# Patient Record
Sex: Female | Born: 1960 | Race: White | Hispanic: No | Marital: Married | Smoking: Never smoker
Health system: Southern US, Community
[De-identification: ages and names within clinical notes are randomized; demographics above are authoritative.]

## PROBLEM LIST (undated history)

## (undated) DIAGNOSIS — L709 Acne, unspecified: Secondary | ICD-10-CM

## (undated) DIAGNOSIS — K259 Gastric ulcer, unspecified as acute or chronic, without hemorrhage or perforation: Secondary | ICD-10-CM

## (undated) DIAGNOSIS — S82891A Other fracture of right lower leg, initial encounter for closed fracture: Secondary | ICD-10-CM

## (undated) DIAGNOSIS — M1990 Unspecified osteoarthritis, unspecified site: Secondary | ICD-10-CM

## (undated) DIAGNOSIS — R569 Unspecified convulsions: Secondary | ICD-10-CM

## (undated) DIAGNOSIS — F419 Anxiety disorder, unspecified: Secondary | ICD-10-CM

## (undated) HISTORY — DX: Unspecified osteoarthritis, unspecified site: M19.90

## (undated) HISTORY — PX: JOINT REPLACEMENT: SHX530

## (undated) HISTORY — DX: Unspecified convulsions: R56.9

## (undated) HISTORY — DX: Other fracture of right lower leg, initial encounter for closed fracture: S82.891A

## (undated) HISTORY — DX: Gastric ulcer, unspecified as acute or chronic, without hemorrhage or perforation: K25.9

## (undated) HISTORY — PX: TONSILLECTOMY: SUR1361

## (undated) HISTORY — DX: Acne, unspecified: L70.9

---

## 1993-03-29 HISTORY — PX: KNEE ARTHROSCOPY: SUR90

## 1995-03-30 HISTORY — PX: BREAST LUMPECTOMY: SHX2

## 1997-10-29 ENCOUNTER — Other Ambulatory Visit: Admission: RE | Admit: 1997-10-29 | Discharge: 1997-10-29 | Payer: Self-pay | Admitting: Gynecology

## 1998-10-28 ENCOUNTER — Other Ambulatory Visit: Admission: RE | Admit: 1998-10-28 | Discharge: 1998-10-28 | Payer: Self-pay | Admitting: Gynecology

## 1999-11-20 ENCOUNTER — Other Ambulatory Visit: Admission: RE | Admit: 1999-11-20 | Discharge: 1999-11-20 | Payer: Self-pay | Admitting: Gynecology

## 2000-09-13 ENCOUNTER — Ambulatory Visit (HOSPITAL_COMMUNITY): Admission: RE | Admit: 2000-09-13 | Discharge: 2000-09-13 | Payer: Self-pay | Admitting: Gynecology

## 2000-09-13 ENCOUNTER — Encounter: Payer: Self-pay | Admitting: Gynecology

## 2001-04-04 ENCOUNTER — Other Ambulatory Visit: Admission: RE | Admit: 2001-04-04 | Discharge: 2001-04-04 | Payer: Self-pay | Admitting: Gynecology

## 2001-10-03 ENCOUNTER — Other Ambulatory Visit: Admission: RE | Admit: 2001-10-03 | Discharge: 2001-10-03 | Payer: Self-pay | Admitting: Gynecology

## 2002-05-31 ENCOUNTER — Other Ambulatory Visit: Admission: RE | Admit: 2002-05-31 | Discharge: 2002-05-31 | Payer: Self-pay | Admitting: Gynecology

## 2003-05-22 ENCOUNTER — Ambulatory Visit (HOSPITAL_COMMUNITY): Admission: RE | Admit: 2003-05-22 | Discharge: 2003-05-22 | Payer: Self-pay | Admitting: Gynecology

## 2003-06-28 ENCOUNTER — Other Ambulatory Visit: Admission: RE | Admit: 2003-06-28 | Discharge: 2003-06-28 | Payer: Self-pay | Admitting: Gynecology

## 2004-09-21 ENCOUNTER — Other Ambulatory Visit: Admission: RE | Admit: 2004-09-21 | Discharge: 2004-09-21 | Payer: Self-pay | Admitting: Gynecology

## 2005-09-23 ENCOUNTER — Other Ambulatory Visit: Admission: RE | Admit: 2005-09-23 | Discharge: 2005-09-23 | Payer: Self-pay | Admitting: Gynecology

## 2005-11-02 ENCOUNTER — Encounter: Admission: RE | Admit: 2005-11-02 | Discharge: 2005-11-02 | Payer: Self-pay | Admitting: Gynecology

## 2005-12-17 ENCOUNTER — Encounter (INDEPENDENT_AMBULATORY_CARE_PROVIDER_SITE_OTHER): Payer: Self-pay | Admitting: Specialist

## 2005-12-17 ENCOUNTER — Ambulatory Visit (HOSPITAL_COMMUNITY): Admission: RE | Admit: 2005-12-17 | Discharge: 2005-12-17 | Payer: Self-pay | Admitting: Gynecology

## 2005-12-31 ENCOUNTER — Emergency Department (HOSPITAL_COMMUNITY): Admission: EM | Admit: 2005-12-31 | Discharge: 2006-01-01 | Payer: Self-pay | Admitting: Emergency Medicine

## 2006-05-23 ENCOUNTER — Encounter: Admission: RE | Admit: 2006-05-23 | Discharge: 2006-05-23 | Payer: Self-pay | Admitting: Family Medicine

## 2006-09-28 ENCOUNTER — Other Ambulatory Visit: Admission: RE | Admit: 2006-09-28 | Discharge: 2006-09-28 | Payer: Self-pay | Admitting: Gynecology

## 2006-11-28 HISTORY — PX: ABLATION: SHX5711

## 2006-12-28 HISTORY — PX: ANKLE FRACTURE SURGERY: SHX122

## 2007-01-31 ENCOUNTER — Encounter: Admission: RE | Admit: 2007-01-31 | Discharge: 2007-01-31 | Payer: Self-pay | Admitting: Gynecology

## 2007-10-13 ENCOUNTER — Other Ambulatory Visit: Admission: RE | Admit: 2007-10-13 | Discharge: 2007-10-13 | Payer: Self-pay | Admitting: Obstetrics & Gynecology

## 2008-03-26 ENCOUNTER — Encounter: Admission: RE | Admit: 2008-03-26 | Discharge: 2008-03-26 | Payer: Self-pay | Admitting: Obstetrics & Gynecology

## 2008-03-28 ENCOUNTER — Encounter: Admission: RE | Admit: 2008-03-28 | Discharge: 2008-03-28 | Payer: Self-pay | Admitting: Obstetrics & Gynecology

## 2010-08-14 NOTE — Op Note (Signed)
NAMELAKARA, Gloria Rivera              ACCOUNT NO.:  [id number]   MEDICAL RECORD NO.:  [id number]          PATIENT TYPE:  AMB   LOCATION:  SDC                           FACILITY:  [facility]   PHYSICIAN:  Ivor Costa. Farrel Gobble, M.D. DATE OF BIRTH:  1961/03/02   DATE OF PROCEDURE:  12/17/2005  DATE OF DISCHARGE:                                 OPERATIVE REPORT   PREOPERATIVE DIAGNOSIS:  Menorrhagia.   POSTOPERATIVE DIAGNOSIS:  Menorrhagia.   PROCEDURE:  Dilatation and curettage, hysteroscopy, with NovaSure  endometrial ablation.   SURGEON:  Ivor Costa. Farrel Gobble, M.D.   ANESTHESIA:  General with paracervical block of 10 mL of 0.25% Marcaine.   ESTIMATED BLOOD LOSS:  Minimal.   I&O deficit of lactated Ringer's is approximately 75 mL.   URINE OUTPUT:  Not measured.   FINDINGS:  The uterus was sounded to 9 cm.  The cervix sounded to 3.5.  The  width was 4.5.  The cavity appeared smooth.  What appeared to be a polyp was  removed on endometrial curettings.  Cervix was unremarkable.   PROCEDURE:  The patient was taken to the operating room and general  anesthesia was induced, placed in dorsal lithotomy position, prepped and  draped in the usual sterile fashion.  A bimanual exam was performed,  orientation of the uterus was confirmed.  A bivalve speculum was then placed  in the vagina.  The cervix was visualized, stabilized with a single-tooth  tenaculum, and a paracervical block was placed deep for a total of 10 mL.  The uterine cavity was sounded on two separate the patient's and returned 9.  Using the #8 Pratt dilator, the cervix sounded to 3.5, again done twice.  The NovaSure instrument was then prepared.  The hysteroscope was then  advanced through the cervix.  Dilation to 21 was required to pass into the  cavity.  The cavity was only remarkable for being very wide.  The cavity  appeared smooth.  Endometrial curettings, however, seemed to return a  polypoid-like tissue.  The hysteroscope was then  advanced back through the  cervix and no further residual was seen.  The NovaSure was then advanced  through the cervix.  The fundus was tapped.  The NovaSure was then seated  using the usual maneuvers.  The width of the cavity had been noted to be  4.5.  The CO2 test was passed and then the ablation occurred to the power of  133 for 105 seconds.  The NovaSure was then removed and the hysteroscope was  re-advanced through the  cervix.  A good junction between the cervix and endometrial cavity was seen.  There was good char effect throughout the cavity, which was otherwise  unremarkable.  The patient tolerated procedure well, sponge, lap and needle  counts were correct x2, and she was transferred to the PACU in stable  condition.      Ivor Costa. Farrel Gobble, M.D.  Electronically Signed     THL/MEDQ  D:  12/17/2005  T:  12/19/2005  Job:  [id number]

## 2010-09-21 ENCOUNTER — Other Ambulatory Visit: Payer: Self-pay | Admitting: Obstetrics & Gynecology

## 2010-09-21 DIAGNOSIS — Z1231 Encounter for screening mammogram for malignant neoplasm of breast: Secondary | ICD-10-CM

## 2010-09-24 ENCOUNTER — Ambulatory Visit: Payer: Self-pay

## 2010-10-05 ENCOUNTER — Ambulatory Visit
Admission: RE | Admit: 2010-10-05 | Discharge: 2010-10-05 | Disposition: A | Discharge status: Home / Self Care | Payer: BC Managed Care – PPO | Source: Ambulatory Visit | Attending: Obstetrics & Gynecology | Admitting: Obstetrics & Gynecology

## 2010-10-05 DIAGNOSIS — Z1231 Encounter for screening mammogram for malignant neoplasm of breast: Secondary | ICD-10-CM

## 2010-10-08 ENCOUNTER — Other Ambulatory Visit: Payer: Self-pay | Admitting: Obstetrics & Gynecology

## 2010-10-08 DIAGNOSIS — R928 Other abnormal and inconclusive findings on diagnostic imaging of breast: Secondary | ICD-10-CM

## 2010-10-12 ENCOUNTER — Ambulatory Visit
Admission: RE | Admit: 2010-10-12 | Discharge: 2010-10-12 | Disposition: A | Discharge status: Home / Self Care | Payer: BC Managed Care – PPO | Source: Ambulatory Visit | Attending: Obstetrics & Gynecology | Admitting: Obstetrics & Gynecology

## 2010-10-12 DIAGNOSIS — R928 Other abnormal and inconclusive findings on diagnostic imaging of breast: Secondary | ICD-10-CM

## 2010-10-23 ENCOUNTER — Encounter (HOSPITAL_BASED_OUTPATIENT_CLINIC_OR_DEPARTMENT_OTHER)
Admission: RE | Admit: 2010-10-23 | Discharge: 2010-10-23 | Disposition: A | Discharge status: Home / Self Care | Payer: BC Managed Care – PPO | Source: Ambulatory Visit | Attending: Orthopedic Surgery | Admitting: Orthopedic Surgery

## 2010-10-27 ENCOUNTER — Ambulatory Visit (HOSPITAL_BASED_OUTPATIENT_CLINIC_OR_DEPARTMENT_OTHER)
Admission: RE | Admit: 2010-10-27 | Discharge: 2010-10-27 | Disposition: A | Discharge status: Home / Self Care | Payer: BC Managed Care – PPO | Source: Ambulatory Visit | Attending: Orthopedic Surgery | Admitting: Orthopedic Surgery

## 2010-10-27 DIAGNOSIS — Z01812 Encounter for preprocedural laboratory examination: Secondary | ICD-10-CM | POA: Insufficient documentation

## 2010-10-27 DIAGNOSIS — M67919 Unspecified disorder of synovium and tendon, unspecified shoulder: Secondary | ICD-10-CM | POA: Insufficient documentation

## 2010-10-27 DIAGNOSIS — M7500 Adhesive capsulitis of unspecified shoulder: Secondary | ICD-10-CM | POA: Insufficient documentation

## 2010-10-27 DIAGNOSIS — M719 Bursopathy, unspecified: Secondary | ICD-10-CM | POA: Insufficient documentation

## 2010-10-27 LAB — POCT HEMOGLOBIN-HEMACUE: Hemoglobin: 14 g/dL (ref 12.0–15.0)

## 2010-10-29 NOTE — Op Note (Signed)
NAMECAYCEE, Rivera NO.:  [id number]  MEDICAL RECORD NO.:  [id number]  LOCATION:                                 FACILITY:  PHYSICIAN:  Katy Fitch. Pao Haffey, M.D. DATE OF BIRTH:  [birth year]  DATE OF PROCEDURE:  10/27/2010 DATE OF DISCHARGE:                              OPERATIVE REPORT   PREOPERATIVE DIAGNOSIS:  Painful right shoulder x3 years with multiple pathologies including; 1. Clinical adhesive capsulitis. 2. MRI documented partial subscapularis tendinopathy. 3. MRI documented partial-thickness rotator cuff tear.  POSTOPERATIVE DIAGNOSES: 1. Adhesive capsulitis. 2. 10% upper subscapularis degenerative tear. 3. 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 4. Adhesive bursitis of subacromial and subdeltoid space.  OPERATIONS: 1. Examination of right shoulder under anesthesia documenting     significant adhesive capsulitis. 2. Gentle manipulation of shoulder. 3. Arthroscopic capsulectomy with debridement of rotator interval,     granulation tissue, tenolysis of subscapularis tendon and     hemostasis. 4. Partial debridement of subscapularis and supraspinatus partial     thickness degenerative tendon tears. 5. Subacromial bursoscopy and bursectomy with tenolysis of rotator     cuff.  The partial-thickness rotator cuff tendon tear was measured     at approximately 5 mm width with maximum loss of the attachment at     greater tuberosity of approximately 6 mm.  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 Cindee Rivera for evaluation and management of a chronically painful right shoulder.  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  Her clinical examination suggested adhesive capsulitis.  Dr. Merlyn Lot performed an MRI, which revealed partial-thickness rotator cuff tearing.  She was  referred for an upper extremity orthopedic shoulder consult.  Preoperatively, we had a detailed discussion regarding her predicament. I offered steroid injection for her adhesive capsulitis symptoms.  She is very needle phobic and requested that we proceed to examination of her shoulder under anesthesia in appropriate intervention.  Preoperatively, we reminded Gloria Rivera that we cannot do a procedure designed to recover motion and a procedure to repair tendons at the same time.  Rotator cuff repair in the setting of adhesive capsulitis can be very problematic.  Therefore, we typically will address recovery of motion as our index procedure and if her partial-thickness rotator cuff tears become more symptomatic at a later date, we would recommend a second procedure to repair the tendons.  Questions regarding the anticipated procedure were invited and answered in detail.  Preoperatively, Gloria Rivera and I had a detailed informed consent.  She also had detailed anesthesia informed consent with Dr. Gelene Mink.  General anesthesia by endotracheal technique with a supplemental plexus block was recommended and accepted.  Under Dr. Thornton Dales direct supervision, IV access was obtained in the holding area followed by placement of a ultrasound-guided right brachial plexus block with ropivacaine.  This was well tolerated.  Gloria Rivera was then transferred to room #2 of the [facility] and placed in supine position on the operating table.  Under Dr. Thornton Dales direct supervision, general endotracheal anesthesia  was accomplished with the aid of a GlideScope.  She was then carefully positioned into the beach-chair position with the aid of a torso and head holder designed for arthroscopy with passive pumps on her calves and a warming blanket.  Examination of the right shoulder under anesthesia revealed moderate adhesive capsulitis with combined elevation of 160, external  rotation limited at 70 degrees with 90 degrees abduction and internal rotation of less than 20 degrees.  With gentle manipulation, we increased her range of motion to combined elevation of 180 degrees external, rotation 90 degrees, abduction 95 degrees, internal rotation 80 degrees with 90 degrees abduction.  We then had a routine time-out.  Her right shoulder and forequarter were prepped with DuraPrep and draped with impervious arthroscopy drapes.  A skin marking pencil was used to delineate the landmarks of the shoulder followed by placement of the arthroscopic cannula through a standard posterior-viewing portal.  Diagnostic arthroscopy revealed intact hyaline articular cartilage surfaces of the glenoid and humeral head.  The biceps origin was stable at the superior labrum.  The biceps tendon was normal through the rotator interval.  The subscapularis was examined with a 30 and 70 degree scope.  The upper 10% of the fibers of the subscapularis were degenerative and fibrillated.  The remaining footprint of the subscapularis appeared normal.  There was no crescent sign.  The biceps tendon was stable at the bicipital groove.  The subscapularis central and inferior aspect was completely normal.  There were significant adhesive capsulitis granulation tissues of the rotator interval and significant scarring of the subscapularis to the region of the anterior superior glenohumeral ligament.  The deep surface of the supraspinous and infraspinatus was inspected.  There was degenerative tendon noted.  With the aid of a suction shaver brought in through an anterior portal, we performed tenolysis of the subscapularis, debridement of the fibrillated portion of the subscapularis.  The rotator interval granulation tissue was removed and hemostasis was achieved with bipolar cautery.  The supraspinatus tendon was debrided to stable tendon fiber.  We then measured with a spinal needle brought in through  an anterolateral portal, the thickness of the cuff.  The measurements on the scope, shaver were used to ascertain that the PASTA lesion was between 30% and 50% the typical thickness of the rotator cuff insertion.  After completion of the intra-articular debridement, hemostasis was achieved with bipolar cautery.  In my judgment given that her primary pain complaint was due to adhesive capsulitis.  Our treatment plan will be to recover motion and strength initially.  If her discomfort from a partial articular surface rotator cuff tear continuous to be problematic, at some point as a cuff tear progresses, formal repair of the rotator cuff would be indicated.  In my experience, it is extremely challenging to repair of the rotator cuff in the setting of active adhesive capsulitis and avoid extreme stiffness.  After completion of the intra-articular portion of the procedure, the scope was removed and placed in the subacromial space.  Florid bursitis was identified.  A complete bursectomy was accomplished with a suction shaver, brought in through an anterolateral portal.  Hemostasis was achieved with bipolar cautery.  The intact surface of the bursal side of the rotator cuff was documented with a digital camera.  The arthroscopic equipment was removed.  The portals were repaired with 3-0 Prolene followed by application of voluminous dressing with sterile gauze, ABD pads, and paper tape.  Mrs. Pop was was placed in a sling, transferred to  the recovery room with stable vital signs.  She will return to our office for follow up in 24 hours for dressing change and placement of protective Tegaderm dressings to the portals and initiation of range of motion program under the supervision of our physical therapy team.     Katy Fitch. Allannah Kempen, M.D.     RVS/MEDQ  D:  10/27/2010  T:  10/28/2010  Job:  [id number]  cc:   Gloria Rivera, M.D. Dario Guardian, M.D.  Electronically Signed by  Josephine Igo M.D. on 10/29/2010 10:03:49 AM

## 2011-06-11 ENCOUNTER — Encounter (HOSPITAL_COMMUNITY): Payer: Self-pay | Admitting: Emergency Medicine

## 2011-06-11 ENCOUNTER — Encounter (HOSPITAL_COMMUNITY): Payer: Self-pay | Admitting: *Deleted

## 2011-06-11 ENCOUNTER — Emergency Department (HOSPITAL_COMMUNITY)
Admission: EM | Admit: 2011-06-11 | Discharge: 2011-06-11 | Disposition: A | Discharge status: Home / Self Care | Payer: BC Managed Care – PPO | Attending: Emergency Medicine | Admitting: Emergency Medicine

## 2011-06-11 ENCOUNTER — Emergency Department (HOSPITAL_COMMUNITY)
Admission: EM | Admit: 2011-06-11 | Discharge: 2011-06-11 | Discharge status: Against Medical Advice | Payer: BC Managed Care – PPO | Attending: Emergency Medicine | Admitting: Emergency Medicine

## 2011-06-11 DIAGNOSIS — R296 Repeated falls: Secondary | ICD-10-CM | POA: Insufficient documentation

## 2011-06-11 DIAGNOSIS — IMO0002 Reserved for concepts with insufficient information to code with codable children: Secondary | ICD-10-CM | POA: Insufficient documentation

## 2011-06-11 DIAGNOSIS — S01501A Unspecified open wound of lip, initial encounter: Secondary | ICD-10-CM | POA: Insufficient documentation

## 2011-06-11 DIAGNOSIS — Z79899 Other long term (current) drug therapy: Secondary | ICD-10-CM | POA: Insufficient documentation

## 2011-06-11 DIAGNOSIS — Y9389 Activity, other specified: Secondary | ICD-10-CM | POA: Insufficient documentation

## 2011-06-11 DIAGNOSIS — F411 Generalized anxiety disorder: Secondary | ICD-10-CM | POA: Insufficient documentation

## 2011-06-11 DIAGNOSIS — S01511A Laceration without foreign body of lip, initial encounter: Secondary | ICD-10-CM

## 2011-06-11 DIAGNOSIS — Y998 Other external cause status: Secondary | ICD-10-CM | POA: Insufficient documentation

## 2011-06-11 HISTORY — DX: Anxiety disorder, unspecified: F41.9
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
## 2011-06-11 NOTE — ED Notes (Signed)
dermabond used by [employer] EDPA

## 2011-06-11 NOTE — ED Notes (Signed)
Bleeding controlled at this time.

## 2011-06-11 NOTE — Discharge Instructions (Signed)
Keep face clean with mild soap and water. Do not pick off skin glue rather let it naturally fall off. Monitor face for signs or infection to include increasing redness, swelling or drainage of wound. After pearly scar appears it is very important to keep scar covered with sun screen to get the best cosmetic outcome.   Facial Laceration A facial laceration is a cut on the face. It can take 1 to 2 years for the scar to heal completely. HOME CARE  For stitches (sutures):  Keep the cut clean and dry.   If you have a bandage (dressing), change it at least once a day. Change the bandage if it gets wet or dirty, or as told by your doctor.   Wash the cut with soap and water 2 times a day. Rinse the cut with water. Pat it dry with a clean towel.   Put a thin layer of medicated cream on the cut as told by your doctor.   You may shower after the first 24 hours. Do not soak the cut in water until the stitches are removed.   Only take medicines as told by your doctor.   Have your stitches removed as told by your doctor.   Do not wear makeup until a few days after your stitches are removed.  For skin adhesive strips:  Keep the cut clean and dry.   Do not get the strips wet. You may take a bath, but be careful to keep the cut dry.   If the cut gets wet, pat it dry with a clean towel.   The strips will fall off on their own. Do not remove the strips that are still stuck to the cut.  For wound glue:  You may shower or take baths. Do not soak or scrub the cut. Do not swim. Avoid heavy sweating until the glue falls off on its own. After a shower or bath, pat the cut dry with a clean towel.   Do not put medicine or makeup on your cut until the glue falls off.   If you have a bandage, do not put tape over the glue.   Avoid lots of sunlight or tanning lamps until the glue falls off. Put sunscreen on the cut for the first year to reduce your scar.   The glue will fall off on its own. Do not pick at  the glue.  You may need a tetanus shot if:  You cannot remember when you had your last tetanus shot.   You have never had a tetanus shot.  If you need a tetanus shot and you choose not to have one, you may get tetanus. Sickness from tetanus can be serious. GET HELP RIGHT AWAY IF:   Your cut gets red, painful, or puffy (swollen).   There is yellowish-white fluid (pus) coming from the cut.   You have chills or a fever.  MAKE SURE YOU:   Understand these instructions.   Will watch your condition.   Will get help right away if you are not doing well or get worse.  Document Released: 09/01/2007 Document Revised: 03/04/2011 Document Reviewed: 09/08/2010 [facility] Patient Information 2012 [place], [place].
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
## 2011-06-11 NOTE — ED Provider Notes (Signed)
Medical screening examination/treatment/procedure(s) were performed by non-physician practitioner and as supervising physician I was immediately available for consultation/collaboration.   Avalyn Molino, MD 06/11/11 1537 
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
## 2011-11-03 ENCOUNTER — Other Ambulatory Visit: Payer: Self-pay | Admitting: Obstetrics & Gynecology

## 2011-11-03 DIAGNOSIS — Z1231 Encounter for screening mammogram for malignant neoplasm of breast: Secondary | ICD-10-CM

## 2011-11-04 ENCOUNTER — Ambulatory Visit: Payer: BC Managed Care – PPO

## 2011-12-06 ENCOUNTER — Ambulatory Visit
Admission: RE | Admit: 2011-12-06 | Discharge: 2011-12-06 | Disposition: A | Discharge status: Home / Self Care | Payer: BC Managed Care – PPO | Source: Ambulatory Visit | Attending: Obstetrics & Gynecology | Admitting: Obstetrics & Gynecology

## 2011-12-06 DIAGNOSIS — Z1231 Encounter for screening mammogram for malignant neoplasm of breast: Secondary | ICD-10-CM

## 2012-10-05 ENCOUNTER — Telehealth: Payer: Self-pay | Admitting: Gynecology

## 2012-10-05 NOTE — Telephone Encounter (Signed)
Medication question for nurse re: generic Effexor. Wants to increase dosage.

## 2012-10-05 NOTE — Telephone Encounter (Signed)
Ok to increase to 150, please call in, refill to annual

## 2012-10-05 NOTE — Telephone Encounter (Signed)
Spoke with pt about generic effexor 75 mg she has been taking for 1.5 years. SM originally prescribed, and TL refilled more recently. Pt is experiencing a stressful summer with daughter getting married, a grandchild on the way, and an impending move. Pt feeling stressed out, and wondering if an increase in her dose would help get her through the next few months. Please advise.

## 2012-10-06 MED ORDER — VENLAFAXINE HCL ER 150 MG PO CP24: 150.0000 mg | ORAL_CAPSULE | Freq: Every day | ORAL | Status: DC

## 2012-10-06 NOTE — Telephone Encounter (Signed)
Patient aware of OK from Dr. Farrel Gobble to increase Effexor to 150mg  capsule once a day. Notified of AEX is due 04/18/2013. Rx e-scribed to [facility].

## 2012-12-27 HISTORY — PX: OTHER SURGICAL HISTORY: SHX169

## 2012-12-28 ENCOUNTER — Ambulatory Visit: Payer: Self-pay | Admitting: Otolaryngology

## 2013-01-01 LAB — PATHOLOGY REPORT

## 2013-01-11 ENCOUNTER — Other Ambulatory Visit: Payer: Self-pay

## 2013-04-17 ENCOUNTER — Encounter: Payer: Self-pay | Admitting: Gynecology

## 2013-04-18 ENCOUNTER — Ambulatory Visit: Payer: Self-pay | Admitting: Gynecology

## 2013-04-23 ENCOUNTER — Ambulatory Visit (INDEPENDENT_AMBULATORY_CARE_PROVIDER_SITE_OTHER): Payer: Federal, State, Local not specified - PPO | Admitting: Gynecology

## 2013-04-23 ENCOUNTER — Encounter: Payer: Self-pay | Admitting: Gynecology

## 2013-04-23 VITALS — BP 117/77 | HR 71 | Resp 14 | Ht 66.0 in | Wt 154.0 lb

## 2013-04-23 DIAGNOSIS — Z0000 Encounter for general adult medical examination without abnormal findings: Secondary | ICD-10-CM

## 2013-04-23 DIAGNOSIS — N943 Premenstrual tension syndrome: Secondary | ICD-10-CM

## 2013-04-23 DIAGNOSIS — Z01419 Encounter for gynecological examination (general) (routine) without abnormal findings: Secondary | ICD-10-CM

## 2013-04-23 LAB — POCT URINALYSIS DIPSTICK
RBC UA: 1
UROBILINOGEN UA: NEGATIVE
pH, UA: 5

## 2013-04-23 MED ORDER — VENLAFAXINE HCL ER 150 MG PO CP24
150.0000 mg | ORAL_CAPSULE | Freq: Every day | ORAL | Status: DC
Start: 2013-04-23 — End: 2014-01-11

## 2013-04-23 NOTE — Progress Notes (Signed)
53 y.o. Married Caucasian female   [phone] here for annual exam. Pt reports menses are not absent since ablation.  She does not report hot flashes, rarely have night sweats, does not have vaginal dryness.  She is not using lubricants.  She does not report post-menopasual bleeding.  Pt recently moved to [place] but is here for annual.  Pt does not have  Menopausal symptoms, reports monthly mastalgia has resolved.   No LMP recorded. Patient has had an ablation.          Sexually active: yes  The current method of family planning is Ablation & Husband has vasectomy.    Exercising: yes  walking qd Last pap: 03/25/11 NEG HR HPV Abnormal PAP: no Mammogram: 12/07/11 Bi-Rads 1 BSE: no Colonoscopy: none DEXA: none Alcohol: 2 drinks/wk Tobacco: no   Health Maintenance  Topic Date Due  . Pap Smear  04/29/1977  . Tetanus/tdap  04/29/1978  . Colonoscopy  04/29/2009  . Influenza Vaccine  10/27/2012  . Mammogram  12/05/2013    Family History  Problem Relation Age of Onset  . Heart attack Maternal Uncle   . Breast cancer Maternal Grandmother     There are no active problems to display for this patient.   Past Medical History  Diagnosis Date  . Anxiety   . Adult acne   . Stomach ulcer   . Fracture of right ankle     Past Surgical History  Procedure Laterality Date  . Joint replacement    . Knee arthroscopy Left 1995  . Ankle fracture surgery Right 12/2006  . Ablation  11/2006  . Breast lumpectomy  1997    Benign    Allergies: Bee venom  Current Outpatient Prescriptions  Medication Sig Dispense Refill  . ampicillin (PRINCIPEN) 500 MG capsule Take 500 mg by mouth 2 (two) times daily.      . calcium carbonate (OS-CAL) 600 MG TABS Take 600 mg by mouth daily.      . citalopram (CELEXA) 20 MG tablet Take 20 mg by mouth daily.      . ergocalciferol (VITAMIN D2) 50000 UNITS capsule Take 50,000 Units by mouth once a week. Thursday      . L-Lysine 500 MG TABS Take 1 tablet by mouth  daily.      Marland Kitchen LORazepam (ATIVAN) 1 MG tablet Take 1.5 mg by mouth once as needed. Before appointments for anxiety      . Multiple Vitamin (MULITIVITAMIN WITH MINERALS) TABS Take 1 tablet by mouth daily.      Marland Kitchen venlafaxine XR (EFFEXOR-XR) 150 MG 24 hr capsule Take 1 capsule (150 mg total) by mouth daily. Will need to call and make AEX appointment @ January 21st. For further refills.  30 capsule  7   No current facility-administered medications for this visit.    ROS: Pertinent items are noted in HPI.  Exam:    Ht 5\' 6"  (1.676 m)  Wt 154 lb (69.854 kg)  BMI 24.87 kg/m2 Weight change: @WEIGHTCHANGE @ Last 3 height recordings:  Ht Readings from Last 3 Encounters:  04/23/13 5\' 6"  (1.676 m)   General appearance: alert, cooperative and appears stated age Head: Normocephalic, without obvious abnormality, atraumatic Neck: no adenopathy, no carotid bruit, no JVD, supple, symmetrical, trachea midline and thyroid not enlarged, symmetric, no tenderness/mass/nodules Lungs: clear to auscultation bilaterally Breasts: normal appearance, no masses or tenderness Heart: regular rate and rhythm, S1, S2 normal, no murmur, click, rub or gallop Abdomen: soft, non-tender; bowel sounds normal; no  masses,  no organomegaly Extremities: extremities normal, atraumatic, no cyanosis or edema Skin: Skin color, texture, turgor normal. No rashes or lesions Lymph nodes: Cervical, supraclavicular, and axillary nodes normal. no inguinal nodes palpated Neurologic: Grossly normal   Pelvic: External genitalia:  no lesions              Urethra: normal appearing urethra with no masses, tenderness or lesions              Bartholins and Skenes: normal                 Vagina: normal appearing vagina with normal color and discharge, no lesions              Cervix: normal appearance              Pap taken: no        Bimanual Exam:  Uterus:  uterus is normal size, shape, consistency and nontender                                       Adnexa:    normal adnexa in size, nontender and no masses                                      Rectovaginal: Confirms                                      Anus:  normal sphincter tone, no lesions  A: well woman     P: mammogram annual pap smear  Not indicated Refill effexor  return annually or prn Discussed PAP guideline changes, importance of weight bearing exercises, calcium, vit D and balanced diet.  An After Visit Summary was printed and given to the patient.

## 2013-04-23 NOTE — Patient Instructions (Signed)

## 2014-01-11 ENCOUNTER — Telehealth: Payer: Self-pay | Admitting: Gynecology

## 2014-01-11 DIAGNOSIS — N943 Premenstrual tension syndrome: Secondary | ICD-10-CM

## 2014-01-11 MED ORDER — VENLAFAXINE HCL ER 75 MG PO CP24: 225.0000 mg | ORAL_CAPSULE | Freq: Every day | ORAL | Status: DC

## 2014-01-11 NOTE — Telephone Encounter (Signed)
Pt would like to speak with nurse regarding her medication. Pt available until 1:00 or after 2:45.

## 2014-01-11 NOTE — Telephone Encounter (Signed)
Called pt.  Advised I was increasing to 225mg  daily.  She will take 3 capsules daily.  Advised to take the same time daily.  She will call with any issues.  Encounter closed.

## 2014-01-11 NOTE — Telephone Encounter (Signed)
Spoke with patient. She is requesting to increase her effexor-xr dose. She is feeling increased symptoms of depression and would like to increase dose if possible. Father has recently passed away. 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  Patient has moved out of area but is current on AEX, last with Dr. Farrel GobbleLathrop 03/2013. She is [facility] in [place].   Patient denies thoughts of self harm or harm to anyone else.  Advised patient would send her request to Dr. Hyacinth MeekerMiller to request increase in effexor-xr. New pharmacy information placed. Advised would call back with message from Dr. Hyacinth MeekerMiller. Patient agreeable.

## 2014-01-28 ENCOUNTER — Encounter: Payer: Self-pay | Admitting: Gynecology

## 2014-07-19 NOTE — Op Note (Signed)
PATIENT NAME:  [name], [name] MR#:  [id number] DATE OF BIRTH:  December 22, 1960  DATE OF PROCEDURE:  12/28/2012  PREOPERATIVE DIAGNOSIS:  Frontal skull osteomas (3).   POSTOPERATIVE DIAGNOSIS:  Frontal skull osteomas (3).    PROCEDURE:  Resection of the frontal skull osteomas.   SURGEON: Zackery BarefootJ. Madison Monte Bronder, M.D.   DESCRIPTION OF PROCEDURE: The patient was placed in the supine position on the operating room table. After general LMA anesthesia had been induced, the patient was turned 90 degrees counterclockwise from anesthesia and placed in a modified beach chair position. The osteomas were palpated. The planned incisions were marked, locally anesthetized, prepped and draped in the usual fashion. A #15 blade was used to make an incision on the left side. The first dissection was carried down to the osteoma in a subfrontalis plane. The osteoma was skeletonized with a Therapist, nutritionalreer elevator and a curved osteotomes was used to remove the osteoma. A straight osteotome was used to remove the slightly elevated central residual central core. The more superior osteoma was skeletonized in a similar fashion and removed with a curved osteotome. The right side incision was made and again isolated in a frontalis plane and then the periosteum was reflected off of the osteoma and it was also removed with an osteotome curved at first and then straight. The 4 mm cutting burr on the TPS system was then used to smooth out the contours. The wounds were then irrigated, suctioned and the wounds were closed with 5-0 Vicryl and 6-0 nylon suture. The patient was returned to anesthesia, allowed to emerge from anesthesia in the operating room, and taken to the recovery room in stable condition. There were no complications.   ESTIMATED BLOOD LOSS:  10 mL    ____________________________ J. Gertie BaronMadison Cairo Agostinelli, MD j[facility]:cc D: 12/28/2012 21:00:00 ET T: 12/28/2012 21:35:17 ET JOB#: [id number]  cc: Zackery BarefootJ. Madison Cam Dauphin, MD, <Dictator> Wendee CoppJMADISON Lamiracle Chaidez  MD ELECTRONICALLY SIGNED 01/01/2013 8:37

## 2014-08-15 ENCOUNTER — Other Ambulatory Visit: Payer: Self-pay | Admitting: Obstetrics & Gynecology

## 2014-08-15 NOTE — Telephone Encounter (Signed)
Dr. Hyacinth MeekerMiller patient has moved to [place], please advise.

## 2014-08-15 NOTE — Telephone Encounter (Signed)
Pt returned call and left message that [place] has moved to [place] and will not be scheduling an appointment.

## 2014-08-15 NOTE — Telephone Encounter (Signed)
Rx declined  

## 2014-08-15 NOTE — Telephone Encounter (Signed)
Medication refill request: Effexor 75 mg  Last AEX:  04/23/13 with TL  Next AEX:  No AEX scheduled Last MMG (if hormonal medication request): N/A Refill authorized: ?  Left Message To Call Back

## 2014-09-10 ENCOUNTER — Telehealth: Payer: Self-pay | Admitting: Obstetrics & Gynecology

## 2014-09-10 NOTE — Telephone Encounter (Signed)
That should be ok.  Please let he know I will be coming from the OR so I cannot guarantee I will be here on time but will try.

## 2014-09-10 NOTE — Telephone Encounter (Signed)
Left message to call Denyce Harr at [phone]. 

## 2014-09-10 NOTE — Telephone Encounter (Signed)
Spoke with patient. Patient states that she recently moved to [facility] but is a long time patient of Dr.Miller's. Patient did see another MD in [place] for her aex but has been experiencing menopausal symptoms. Patient states all of her labs were normal and no further recommendations were made. "I really trust Dr.Miller and have seen her forever. I am coming back into town for a day and a half and really want to see her." Patient will be in town 6/20 and the morning of 6/21. Advised patient will need to check with provider regarding scheduling as she will be performing surgery those two mornings. Patient is agreeable.   Dr.Miller, please review and advise. There is a 11:15am slot after surgery on 6/21 available if this is okay?

## 2014-09-10 NOTE — Telephone Encounter (Signed)
Patient is having some menopausal symptoms and would like to talk to Dr.Miller. Last seen 04/23/13.

## 2014-09-10 NOTE — Telephone Encounter (Signed)
Spoke with patient. Advised of message as seen below from Dr.Miller. Patient is agreeable and verbalizes understanding. Appointment scheduled for 6/21 at 11:15am with Dr.Miller. Patient placed on wait list in EPIC for 6/20. Patient is agreeable,  Routing to provider for final review. Patient agreeable to disposition. Will close encounter.

## 2014-09-17 ENCOUNTER — Ambulatory Visit (INDEPENDENT_AMBULATORY_CARE_PROVIDER_SITE_OTHER): Payer: Federal, State, Local not specified - PPO | Admitting: Obstetrics & Gynecology

## 2014-09-17 ENCOUNTER — Other Ambulatory Visit: Payer: Self-pay | Admitting: Obstetrics & Gynecology

## 2014-09-17 ENCOUNTER — Encounter: Payer: Self-pay | Admitting: Obstetrics & Gynecology

## 2014-09-17 VITALS — BP 102/66 | HR 80 | Resp 16 | Ht 66.0 in | Wt 146.0 lb

## 2014-09-17 DIAGNOSIS — M25559 Pain in unspecified hip: Secondary | ICD-10-CM

## 2014-09-17 DIAGNOSIS — M791 Myalgia, unspecified site: Secondary | ICD-10-CM

## 2014-09-17 DIAGNOSIS — N951 Menopausal and female climacteric states: Secondary | ICD-10-CM | POA: Diagnosis not present

## 2014-09-17 LAB — COMPREHENSIVE METABOLIC PANEL
ALBUMIN: 4.2 g/dL (ref 3.5–5.2)
ALK PHOS: 73 U/L (ref 39–117)
ALT: 19 U/L (ref 0–35)
AST: 22 U/L (ref 0–37)
BUN: 10 mg/dL (ref 6–23)
CALCIUM: 9.5 mg/dL (ref 8.4–10.5)
CHLORIDE: 103 meq/L (ref 96–112)
CO2: 27 mEq/L (ref 19–32)
Creat: 0.51 mg/dL (ref 0.50–1.10)
Glucose, Bld: 84 mg/dL (ref 70–99)
POTASSIUM: 4.2 meq/L (ref 3.5–5.3)
SODIUM: 140 meq/L (ref 135–145)
TOTAL PROTEIN: 6.8 g/dL (ref 6.0–8.3)
Total Bilirubin: 0.4 mg/dL (ref 0.2–1.2)

## 2014-09-17 LAB — CBC
HEMATOCRIT: 39.6 % (ref 36.0–46.0)
HEMOGLOBIN: 13 g/dL (ref 12.0–15.0)
MCH: 29.6 pg (ref 26.0–34.0)
MCHC: 32.8 g/dL (ref 30.0–36.0)
MCV: 90.2 fL (ref 78.0–100.0)
MPV: 9.8 fL (ref 8.6–12.4)
Platelets: 461 10*3/uL — ABNORMAL HIGH (ref 150–400)
RBC: 4.39 MIL/uL (ref 3.87–5.11)
RDW: 13.5 % (ref 11.5–15.5)
WBC: 6.8 10*3/uL (ref 4.0–10.5)

## 2014-09-17 MED ORDER — CELECOXIB 200 MG PO CAPS: 200.0000 mg | ORAL_CAPSULE | Freq: Two times a day (BID) | ORAL | Status: DC

## 2014-09-17 NOTE — Progress Notes (Signed)
Patient ID: Gloria Rivera, female   DOB: 11/01/1960, 54 y.o.   MRN: [id number]  54 yo G3P3 MWF here for discussion of recent changes in hormonal symptoms.  Pt has been amenorrheic since having an endometrial ablation so really had no idea where she was in regards to menopause until the last several months.  She has really begun to have hot flashes and nights sweats since January.  As well, she is also experiencing new issues as well with joint pain, muscle aches, and fatigue.  She has been taking black cohosh 30mg  twice daily.  This really helped the hot flashes but not the joint and muscle issues.  She recently decided to stop it and see what happened and the hot flashes came right back.  She has restarted the black cohosh and states she can tell a difference.  Reports she broke a toe in January.  After two to three months, it was still red and swollen so she saw an ortho in [place] (where she now lives).  Had x ray done and the toe wasn't healing.  She did report hip pains as well.  Had hip and knee x rays as well and this was negative.  Was started on calcium and Vit D and follow up x rays now show her toe is heeling.  She's found this worrisome given the joint and muscle aches.   With the black cohosh, the hot flashes are much better but the biggest issue is with soreness.  Has tried advil and this does help so if she takes prescription dosages.  Doesn't feel aleve really helps.  Did see NP at gyn office in [place] in May and had blood work.  She brings copies of this today.  It will be scanned into EPIC.  Rheumatoid Factor, ANA were both negative.  Estradiol was low, FSH was 97, Progesterone was low.  TSH was normal.    Pt really not interested in increased breast cancer risks with HRT.  Also wonders if something else is going on since she is having so many body ache issues.  Hasn't established care with PCP in [place] yet.  Highly encouraged to do this.  States she hates [place] and feels like she is  in hell so hasn't really wanted to establish care with anyone.   Assessment:  Joint aches and muscle aches, fatigue since becoming menopausal Declines HRT  Plan:  CBC, CMB, Vit D Celebrex 200mg  daily.  Rx sent to pharmacy in [place]. Highly encouraged to establish care with PCP in [place] area.   Trial of topical progesterone recommended as well.  Can start with obtaining from health food store.  Information provided.    ~30 minutes spent with patient >50% of time was in face to face discussion of above.

## 2014-09-17 NOTE — Patient Instructions (Signed)
Purchase a progesterone cream from [employer] or [employer] and use 1-2 pumps daily to skin.

## 2014-09-18 ENCOUNTER — Telehealth: Payer: Self-pay | Admitting: Obstetrics & Gynecology

## 2014-09-18 DIAGNOSIS — M791 Myalgia, unspecified site: Secondary | ICD-10-CM | POA: Insufficient documentation

## 2014-09-18 DIAGNOSIS — N951 Menopausal and female climacteric states: Secondary | ICD-10-CM | POA: Insufficient documentation

## 2014-09-18 DIAGNOSIS — M25559 Pain in unspecified hip: Secondary | ICD-10-CM | POA: Insufficient documentation

## 2014-09-18 LAB — VITAMIN D 25 HYDROXY (VIT D DEFICIENCY, FRACTURES): VIT D 25 HYDROXY: 54 ng/mL (ref 30–100)

## 2014-09-18 NOTE — Telephone Encounter (Signed)
Patient has some questions regarding her Celebrex prescription directions.

## 2014-09-18 NOTE — Telephone Encounter (Signed)
Call to patient. She has picked up her prescription and states that order is for Celebrex 200 mg po bid. However, prescription is written for only 30 tablets, so would only be 15 day supply. Advised patient reviewed Dr. Rondel Baton note for 200 mg daily so will need to clarify with Dr. Hyacinth Meeker.   Patient agreeable. States "No rush at all." Advised would return call to patient with clarification of order and patient agreeable.   Marland Kitchen

## 2014-09-19 LAB — IRON AND TIBC
%SAT: 44 % (ref 20–55)
IRON: 117 ug/dL (ref 42–145)
TIBC: 263 ug/dL (ref 250–470)
UIBC: 146 ug/dL (ref 125–400)

## 2014-09-19 LAB — FERRITIN: Ferritin: 66 ng/mL (ref 10–291)

## 2014-09-19 NOTE — Telephone Encounter (Signed)
Should be 200mg  daily, only, not BID.  Thanks.

## 2014-09-19 NOTE — Telephone Encounter (Signed)
Call to patient and message from Dr. Hyacinth Meeker given. To take one tablet daily. Medication reconciliation changed to one tablet daily. Patient agreeable. She will return call with any concerns. Routing to provider for final review. Patient agreeable to disposition. Will close encounter.

## 2014-09-24 ENCOUNTER — Telehealth: Payer: Self-pay | Admitting: Obstetrics & Gynecology

## 2014-09-24 NOTE — Telephone Encounter (Signed)
Spoke with patient. Advised of message as seen below from Dr.Miller. Patient is agreeable and verbalizes understanding. Patient would like Dr.Miller to know she has an internist appointment on 10/15/2014 in her local area.

## 2014-09-24 NOTE — Telephone Encounter (Signed)
Spoke with patient. Patient states that she has been researching elevated platelet counts and saw that using Tretinoin gel can increase platelet counts. Patient states she has been using Tretinoin 0.01% gel daily for 4 months every night to entire face. Advised will let Dr.Miller know and we will be in contact with her soon regarding further recommendations. Patient is agreeable.

## 2014-09-24 NOTE — Telephone Encounter (Signed)
Patient is asking to talk with Gloria Rivera again.  °

## 2014-09-24 NOTE — Telephone Encounter (Signed)
Routing to Dr.Miller for review and advise of labs from 09/17/2014. All appears normal except Platelet level which is elevated at 461.

## 2014-09-24 NOTE — Telephone Encounter (Signed)
You can inform her of the elvated platelet level and that I also did iron testing as this is a common reason for elevated platelet ct.  It was normal as well.  I have left message for local hematologist/oncologist to see if any additional testing is needed or if she needs to see local specialist.  I am holding results in my in-box until I get an answer.  Will be back in contact with her regarding any additional testing and/or referral depending on his recommendations.  [place] lives in [place] so I know anything else that is needed will need to be done there.  Thanks.

## 2014-09-24 NOTE — Telephone Encounter (Signed)
Patient is calling to get her results from her last week visit

## 2014-09-26 NOTE — Telephone Encounter (Signed)
Spoke with patient. Patient calling to check on status of call. Advised will speak with Dr.Miller today and return call with update. Patient is agreeable.

## 2014-09-26 NOTE — Telephone Encounter (Signed)
Dr. Myna HidalgoEnnever thought this was moist likely related to joint issues and with all other testing normal, nothing special is needed except repeat it in a year to monitor.  Has the Celebrex helped at all?

## 2014-09-27 NOTE — Telephone Encounter (Signed)
No it shouldn't.  I really think she needs to see a rheumatologist in the [place] area.  I really can't make any recommendations to her due to being so far away and not knowing anyone there.  I know she doesn't have a great relationship with PCP but I highly recommend she get a referral from them.  Also, why taking it at night?  She's sleeping through some of the benefit.  Maybe should take in AM.

## 2014-09-27 NOTE — Telephone Encounter (Signed)
Spoke with patient. Advised of message as seen below from Dr.Miller. Patient is agreeable and verbalizes understanding. Patient states the Celebrex was been "awesome. I notice about a 60 percent change. I take it at night and feel great in the morning. Throughout the day it starts to get a little rough." Patient asking if dosage can be increased at this time to twice per day to help get through the evening. Advised I will speak with Dr.Miller regarding rx and return call. Patient is agreeable.

## 2014-09-27 NOTE — Telephone Encounter (Signed)
Left message to call Kaitlyn at [phone]. 

## 2014-09-27 NOTE — Telephone Encounter (Signed)
Spoke with patient. Advised of message as seen below from Dr.Miller. Patient is agreeable. Patient will begin to take Celebrex in the AM. Has an appointment with internist on 10/15/2014 will discuss referral at that time.  Routing to provider for final review. Patient agreeable to disposition. Will close encounter.

## 2014-10-12 ENCOUNTER — Other Ambulatory Visit: Payer: Self-pay | Admitting: Obstetrics & Gynecology

## 2014-10-14 NOTE — Telephone Encounter (Signed)
Medication refill request: Celebrex 200 Last AEX:  04/23/13 with TL Next AEX: none Last MMG (if hormonal medication request):  Refill authorized: Please advise

## 2014-10-15 NOTE — Telephone Encounter (Signed)
RX declined.  Message send to triage to call pt and see about the follow up she has done.

## 2014-10-16 ENCOUNTER — Telehealth: Payer: Self-pay

## 2014-10-16 NOTE — Telephone Encounter (Signed)
Spoke with patient. Patient states that she established care with an internist yesterday who is running a variety of different tests and lab work on her. States her new internist will be refilling her Celebrex for her. States she does not need a refill at this time as she has plenty of the medication. For further refills will go through her internist.   Routing to provider for final review. Patient agreeable to disposition. Will close encounter.   Patient aware provider will review message and nurse will return call if any additional advice or change of disposition.

## 2014-10-16 NOTE — Telephone Encounter (Signed)
-----   Message from [name], MD sent at 10/15/2014 10:27 AM EDT ----- Regarding: follow up with pt Pt seen here recently due to joint issues/pain.  She was started on celebrex.  [place] lives in [place] and I advised her to follow up with PCP and possibly see rheumatologist.  Refill for celebrex came and I'd like some follow up about any appts she's had before I do a RF.  May also need records.  Thanks.

## 2015-09-01 ENCOUNTER — Ambulatory Visit: Payer: Federal, State, Local not specified - PPO | Admitting: Obstetrics & Gynecology

## 2015-10-17 ENCOUNTER — Encounter: Payer: Self-pay | Admitting: Obstetrics & Gynecology

## 2015-10-17 ENCOUNTER — Ambulatory Visit (INDEPENDENT_AMBULATORY_CARE_PROVIDER_SITE_OTHER): Payer: Self-pay | Admitting: Obstetrics & Gynecology

## 2015-10-17 VITALS — BP 96/58 | HR 86 | Resp 14 | Ht 66.0 in | Wt 152.0 lb

## 2015-10-17 DIAGNOSIS — Z01419 Encounter for gynecological examination (general) (routine) without abnormal findings: Secondary | ICD-10-CM

## 2015-10-17 DIAGNOSIS — Z124 Encounter for screening for malignant neoplasm of cervix: Secondary | ICD-10-CM

## 2015-10-17 MED ORDER — ESTRADIOL-NORETHINDRONE ACET 0.5-0.1 MG PO TABS: 1.0000 | ORAL_TABLET | Freq: Every day | ORAL | Status: DC

## 2015-10-17 MED ORDER — AMPICILLIN 500 MG PO CAPS: 500.0000 mg | ORAL_CAPSULE | Freq: Every day | ORAL | Status: DC

## 2015-10-17 MED ORDER — VENLAFAXINE HCL ER 150 MG PO CP24: 150.0000 mg | ORAL_CAPSULE | Freq: Every day | ORAL | Status: DC

## 2015-10-17 NOTE — Progress Notes (Signed)
55 y.o. [id number] Married Caucasian F here for annual exam.  Reports last year she had so many issues with her joints and worsening hot flashes.  This was changing her life.  [place] moved to [place] last year and they moved back to [place].  Husband's position [place] still based [place] but his territory is [place].  They've been back in [place] for 9 days.    No LMP recorded. Patient has had an ablation.          Sexually active: Yes.    The current method of family planning is vasectomy.    Exercising: Yes.    walking Smoker:  no  Health Maintenance: Pap:  08/2013 per patient  History of abnormal Pap:  yes MMG: 12/2014 BIRADS 1 negative at [facility] Colonoscopy:  never BMD:   07/30/2015 at [facility] Osteopenia, -2.2/-2.4 repeat 3-5 years TDaP:  Up to date Pneumonia vaccine(s):  never Zostavax:   never Hep C testing: discussed with pt.  Pt is needle phobic and will plan to do this with next lab work. Screening Labs: done with Rheumatologist in April, Hb today: done with Rheumatologist in April, Urine today: done with Rheumatologist in April    reports that she has never smoked. She has never used smokeless tobacco. She reports that she drinks about 0.6 oz of alcohol per week. She reports that she does not use illicit drugs.  Past Medical History  Diagnosis Date  . Anxiety   . Adult acne   . Stomach ulcer   . Fracture of right ankle     Past Surgical History  Procedure Laterality Date  . Joint replacement    . Knee arthroscopy Left 1995  . Ankle fracture surgery Right 12/2006  . Ablation  11/2006  . Breast lumpectomy  1997    Benign  . Osteoma removal  12/2012    Current Outpatient Prescriptions  Medication Sig Dispense Refill  . BLACK COHOSH PO Take 30 mg by mouth 2 (two) times daily.    . celecoxib (CELEBREX) 200 MG capsule Take 200 mg by mouth daily.    Marland Kitchen. L-Lysine 500 MG TABS Take 1 tablet by mouth daily.    . Multiple Vitamin (MULITIVITAMIN WITH MINERALS) TABS Take 1 tablet by mouth  daily.    Marland Kitchen. venlafaxine XR (EFFEXOR-XR) 150 MG 24 hr capsule Take 300 mg by mouth daily with breakfast.     No current facility-administered medications for this visit.    Family History  Problem Relation Age of Onset  . Heart attack Maternal Uncle   . Breast cancer Maternal Grandmother     ROS:  Pertinent items are noted in HPI.  Otherwise, a comprehensive ROS was negative.  Exam:   Filed Vitals:   10/17/15 1113  BP: 96/58  Pulse: 86  Resp: 14  Height: 5\' 6"  (1.676 m)  Weight: 152 lb (68.947 kg)    General appearance: alert, cooperative and appears stated age Head: Normocephalic, without obvious abnormality, atraumatic Neck: no adenopathy, supple, symmetrical, trachea midline and thyroid normal to inspection and palpation Lungs: clear to auscultation bilaterally Breasts: normal appearance, no masses or tenderness Heart: regular rate and rhythm Abdomen: soft, non-tender; bowel sounds normal; no masses,  no organomegaly Extremities: extremities normal, atraumatic, no cyanosis or edema Skin: Skin color, texture, turgor normal. No rashes or lesions Lymph nodes: Cervical, supraclavicular, and axillary nodes normal. No abnormal inguinal nodes palpated Neurologic: Grossly normal   Pelvic: External genitalia:  no lesions  Urethra:  normal appearing urethra with no masses, tenderness or lesions              Bartholins and Skenes: normal                 Vagina: normal appearing vagina with normal color and discharge, no lesions              Cervix: no lesions              Pap taken: Yes.   Bimanual Exam:  Uterus:  normal size, contour, position, consistency, mobility, non-tender              Adnexa: normal adnexa and no mass, fullness, tenderness               Rectovaginal: Confirms               Anus:  normal sphincter tone, no lesions  Chaperone was present for exam.  A:  Well Woman with normal exam PMP, on HRT Acne  Osteopenia  P:   Mammogram yearly pap  smear with HR HPV Change HRT to Activella HS 0.5/0.1mg  daily.  #90/4RF RF Effexor  XL daily.  #90/4RF Ampicillin  daily . #90/0RF.  She has appt with Dr. Yetta Barre in September.  (Couldn't get any earlier appt.) Repeat BMD 2018-2019 return annually or prn

## 2015-10-17 NOTE — Patient Instructions (Signed)
 500mg  - 1000 calcium daily.  OsCal Caltrate [phone] IU Vit D daily.

## 2015-10-21 LAB — IPS PAP TEST WITH HPV

## 2016-06-16 ENCOUNTER — Encounter: Payer: Self-pay | Admitting: Obstetrics & Gynecology

## 2016-06-16 ENCOUNTER — Telehealth: Payer: Self-pay

## 2016-06-16 NOTE — Telephone Encounter (Signed)
Telephone encounter created to review with Dr.Miller. 

## 2016-06-16 NOTE — Telephone Encounter (Signed)
Non-Urgent Medical Question  Message [phone]  From Melina ModenaJill P Alejandro To [name], MD Sent 06/16/2016 2:33 PM  Dr. Hyacinth MeekerMiller,  As part of a job application process for [employer], I disclosed that I currently take the prescription medication Effexor (Venlafaxine). They simply need to know from you, the prescribing physician, why I take it. At this point, I'm not even sure why I take it. I don't have anxiety or depression, but I do know that it was originally to help ease PMS symptoms, then (I think) to help with menopause symptoms. I also need a statement from you that I've not had a back disorder or lower leg weakness (this one's a mystery to me). They told me to fax it to them once I receive it. I so appreciate your time and hope you're doing well. See you at my next checkup.  Sincerely,  Gloria SkainsJill Rivera   Responsible Party   Pool - [facility] Clinical Pool No one has taken responsibility for this message.  No actions have been taken on this message.   Dr.Miller, okay to write letter for patient? Should she continue this medication at this time? Please advise.

## 2016-06-17 ENCOUNTER — Encounter: Payer: Self-pay | Admitting: Obstetrics & Gynecology

## 2016-06-17 NOTE — Telephone Encounter (Signed)
Letter has been written.  Gloria Rivera will likely want to read it and make sure it is what she needs.  [facility].  If she desires any changes, she can let us know.  Then I will likely need print and sign a copy for her.

## 2016-06-17 NOTE — Telephone Encounter (Signed)
Letter mailed to patient's home address on file. Will close encounter.

## 2016-06-17 NOTE — Telephone Encounter (Signed)
Spoke with patient. Advised letter has been written by Dr.Miller. Patient would like to review letter through MyChart. Advised letter will be sent out to her now for her review. Patient will let our office know if anything needs to be added or changed. Requests that the final signed copy be mailed to her home address [address] [place], [place] [id number].

## 2016-06-17 NOTE — Progress Notes (Addendum)
Letter sent to patient via MyChart for review.

## 2016-06-17 NOTE — Telephone Encounter (Signed)
Ok to send and close encounter.  Thanks.

## 2016-06-17 NOTE — Telephone Encounter (Signed)
Non-Urgent Medical Question  Message [phone]  From Melina ModenaJill P Daus To [name], MD Sent 06/17/2016 9:08 AM  Perfect letter. Thank you so much! I truly appreciate your time.  Noreene LarssonJill   Responsible Party   Pool - [facility] Clinical Pool No one has taken responsibility for this message.  No actions have been taken on this message.   Letter printed and to Dr.Miller for final review and signature before mailing to patient.

## 2016-08-25 ENCOUNTER — Encounter: Payer: Self-pay | Admitting: Obstetrics & Gynecology

## 2016-10-18 NOTE — Progress Notes (Signed)
56 y.o. [id number] MarriedCaucasianF here for annual exam.  Doing well.  Denies vaginal bleeding.  Biggest issue for her is plantar fasciitis.  Has seen ortho at [facility].  Has done stretches, oral steroids, valium and injections.  Pt reports she also had a bone spur.  Would like second opinion.  Options discussed.  Daughter Rodman Pickle is going to [place] to be with her daughter before her husband is deployed.  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   No LMP recorded. Patient has had an ablation.          Sexually active: Yes.    The current method of family planning is vasectomy.    Exercising: No.  The patient does not participate in regular exercise at present. Smoker:  no  Health Maintenance: Pap:  10/17/15 negative, HR HPV negative  History of abnormal Pap:  yes MMG:  07/14/16 left breast US- BIRADS 3 probably benign- follow up 6 months   Colonoscopy:  Never.  Will do cologuard. BMD:   07/30/15 at [facility]- osteopenia  TDaP:  2014? Pneumonia vaccine(s):  never Zostavax:   never Hep C testing: discuss with provider Screening Labs: discuss with provider, Hb today: discuss with provider   reports that she has never smoked. She has never used smokeless tobacco. She reports that she drinks about 0.6 oz of alcohol per week . She reports that she does not use drugs.  Past Medical History:  Diagnosis Date  . Adult acne   . Anxiety   . Fracture of right ankle   . Stomach ulcer     Past Surgical History:  Procedure Laterality Date  . ABLATION  11/2006  . ANKLE FRACTURE SURGERY Right 12/2006  . BREAST LUMPECTOMY  1997   Benign  . JOINT REPLACEMENT    . KNEE ARTHROSCOPY Left 1995  . Osteoma Removal  12/2012    Current Outpatient Prescriptions  Medication Sig Dispense Refill  . ampicillin (PRINCIPEN) 500 MG capsule Take 1 capsule (500 mg total) by mouth daily. 90 capsule 0  . Estradiol-Norethindrone Acet 0.5-0.1 MG tablet Take 1 tablet by mouth daily. 90 tablet 4  . Multiple  Vitamin (MULITIVITAMIN WITH MINERALS) TABS Take 1 tablet by mouth daily.    . multivitamin-lutein (OCUVITE-LUTEIN) CAPS capsule Take 1 capsule by mouth daily.    Marland Kitchen venlafaxine XR (EFFEXOR-XR) 150 MG 24 hr capsule Take 1 capsule (150 mg total) by mouth daily with breakfast. 90 capsule 4   No current facility-administered medications for this visit.     Family History  Problem Relation Age of Onset  . Heart attack Maternal Uncle   . Breast cancer Maternal Grandmother     ROS:  Foot pain.  Lethargy. Otherwise, a comprehensive ROS was negative.  Exam:   BP 108/68 (BP Location: Right Arm, Patient Position: Sitting, Cuff Size: Normal)   Pulse 76   Resp 14   Ht 5' 5.75" (1.67 m)   Wt 159 lb (72.1 kg)   BMI 25.86 kg/m   Weight change: +7#  Height: 5' 5.75" (167 cm)  Ht Readings from Last 3 Encounters:  10/19/16 5' 5.75" (1.67 m)  10/17/15 5\' 6"  (1.676 m)  09/17/14 5\' 6"  (1.676 m)    General appearance: alert, cooperative and appears stated age Head: Normocephalic, without obvious abnormality, atraumatic Neck: no adenopathy, supple, symmetrical, trachea midline and thyroid normal to inspection and palpation Lungs: clear to auscultation bilaterally Breasts: normal appearance, no masses or tenderness Heart: regular rate and rhythm  Abdomen: soft, non-tender; bowel sounds normal; no masses,  no organomegaly Extremities: extremities normal, atraumatic, no cyanosis or edema Skin: Skin color, texture, turgor normal. No rashes or lesions Lymph nodes: Cervical, supraclavicular, and axillary nodes normal. No abnormal inguinal nodes palpated Neurologic: Grossly normal   Pelvic: External genitalia:  no lesions              Urethra:  normal appearing urethra with no masses, tenderness or lesions              Bartholins and Skenes: normal                 Vagina: normal appearing vagina with normal color and discharge, no lesions              Cervix: no lesions              Pap taken:  No. Bimanual Exam:  Uterus:  normal size, contour, position, consistency, mobility, non-tender              Adnexa: normal adnexa and no mass, fullness, tenderness               Rectovaginal: Confirms               Anus:  normal sphincter tone, no lesions  Chaperone was present for exam.  A:  Well Woman with normal exam PMP, on HRT Acne, [facility] Yet[facility] Osteopenia  P:   Mammogram guidelines reviewed.  Pt is adamant that she was advised no additional follow up needed.  Will try to get updated report from [facility]. Declines colonoscopy.  Cologuard signed today. pap smear not needed Activella HS 0.5/0.1mg  daily.  #90/4RF.  Risks reviewed with pt.  She is absolutely sure she wants to continue her HRT. Effexor 150mg  XL #90/4RF daily.   Lab work obtained: CBC, CMP, Hep C antibody, lipid, TSH, Vit D Return annually or prn

## 2016-10-19 ENCOUNTER — Telehealth: Payer: Self-pay | Admitting: Obstetrics & Gynecology

## 2016-10-19 ENCOUNTER — Ambulatory Visit (INDEPENDENT_AMBULATORY_CARE_PROVIDER_SITE_OTHER): Payer: Federal, State, Local not specified - PPO | Admitting: Obstetrics & Gynecology

## 2016-10-19 ENCOUNTER — Encounter: Payer: Self-pay | Admitting: Obstetrics & Gynecology

## 2016-10-19 VITALS — BP 108/68 | HR 76 | Resp 14 | Ht 65.75 in | Wt 159.0 lb

## 2016-10-19 DIAGNOSIS — Z0000 Encounter for general adult medical examination without abnormal findings: Secondary | ICD-10-CM | POA: Diagnosis not present

## 2016-10-19 DIAGNOSIS — Z205 Contact with and (suspected) exposure to viral hepatitis: Secondary | ICD-10-CM | POA: Diagnosis not present

## 2016-10-19 DIAGNOSIS — Z01419 Encounter for gynecological examination (general) (routine) without abnormal findings: Secondary | ICD-10-CM

## 2016-10-19 MED ORDER — VENLAFAXINE HCL ER 150 MG PO CP24: 150.0000 mg | ORAL_CAPSULE | Freq: Every day | ORAL | 4 refills | Status: DC

## 2016-10-19 MED ORDER — ESTRADIOL-NORETHINDRONE ACET 0.5-0.1 MG PO TABS: 1.0000 | ORAL_TABLET | Freq: Every day | ORAL | 4 refills | Status: DC

## 2016-10-19 NOTE — Patient Instructions (Signed)
  [phone] [facility] in [address] Ernestene MentionJohn Newitt

## 2016-10-19 NOTE — Telephone Encounter (Signed)
Gloria LandAngela at [facility] is asking to talk with Irving Burtonmily again, no details given.

## 2016-10-20 LAB — CBC
HEMOGLOBIN: 13 g/dL (ref 11.1–15.9)
Hematocrit: 38 % (ref 34.0–46.6)
MCH: 30 pg (ref 26.6–33.0)
MCHC: 34.2 g/dL (ref 31.5–35.7)
MCV: 88 fL (ref 79–97)
PLATELETS: 417 10*3/uL — AB (ref 150–379)
RBC: 4.34 x10E6/uL (ref 3.77–5.28)
RDW: 13.4 % (ref 12.3–15.4)
WBC: 7.8 10*3/uL (ref 3.4–10.8)

## 2016-10-20 LAB — LIPID PANEL
CHOLESTEROL TOTAL: 135 mg/dL (ref 100–199)
Chol/HDL Ratio: 2.8 ratio (ref 0.0–4.4)
HDL: 48 mg/dL (ref >39)
LDL CALC: 70 mg/dL (ref 0–99)
TRIGLYCERIDES: 85 mg/dL (ref 0–149)
VLDL CHOLESTEROL CAL: 17 mg/dL (ref 5–40)

## 2016-10-20 LAB — COMPREHENSIVE METABOLIC PANEL
ALBUMIN: 4.3 g/dL (ref 3.5–5.5)
ALT: 22 IU/L (ref 0–32)
AST: 23 IU/L (ref 0–40)
Albumin/Globulin Ratio: 1.7 (ref 1.2–2.2)
Alkaline Phosphatase: 104 IU/L (ref 39–117)
BUN / CREAT RATIO: 17 (ref 9–23)
BUN: 11 mg/dL (ref 6–24)
Bilirubin Total: 0.2 mg/dL (ref 0.0–1.2)
CHLORIDE: 98 mmol/L (ref 96–106)
CO2: 26 mmol/L (ref 20–29)
CREATININE: 0.65 mg/dL (ref 0.57–1.00)
Calcium: 9.4 mg/dL (ref 8.7–10.2)
GFR calc Af Amer: 114 mL/min/{1.73_m2} (ref >59)
GFR calc non Af Amer: 99 mL/min/{1.73_m2} (ref >59)
GLUCOSE: 85 mg/dL (ref 65–99)
Globulin, Total: 2.5 g/dL (ref 1.5–4.5)
Potassium: 4.4 mmol/L (ref 3.5–5.2)
Sodium: 137 mmol/L (ref 134–144)
Total Protein: 6.8 g/dL (ref 6.0–8.5)

## 2016-10-20 LAB — TSH: TSH: 1.59 u[IU]/mL (ref 0.450–4.500)

## 2016-10-20 LAB — HEPATITIS C ANTIBODY: Hep C Virus Ab: <0.1 s/co ratio (ref 0.0–0.9)

## 2016-10-20 LAB — VITAMIN D 25 HYDROXY (VIT D DEFICIENCY, FRACTURES): Vit D, 25-Hydroxy: 55.3 ng/mL (ref 30.0–100.0)

## 2016-10-21 ENCOUNTER — Telehealth: Payer: Self-pay | Admitting: *Deleted

## 2016-10-21 NOTE — Telephone Encounter (Signed)
Returned call to [place]ela at [facility]. Marylene Landngela states that the ultrasound report of left breast from 07/14/16 was compared to the last imaging report they had from patient's MMG in 2016 at [facility] [place]. Marylene Landngela states 6 month follow up is still what is recommended by Dr. Blase MessKoriakin after review of the 2 reports.   Routing to provider for final review. Patient agreeable to disposition. Will close encounter.

## 2016-10-21 NOTE — Telephone Encounter (Signed)
Message noted.  Encounter closed.

## 2016-10-21 NOTE — Telephone Encounter (Signed)
Patient returned your call states that her spouse DOB is 10/11/1958.  If your need anything else give her a call.

## 2016-10-21 NOTE — Telephone Encounter (Signed)
Message left to return call to Gloria Rivera at [phone].    

## 2016-10-22 NOTE — Telephone Encounter (Signed)
Can you please call pt and let her know she does need 6 months follow-up?  She is on HRT and this is important.  Needs recall placed.  Will [facility] if needed.  Thanks.

## 2016-10-24 ENCOUNTER — Encounter: Payer: Self-pay | Admitting: Obstetrics & Gynecology

## 2016-10-25 ENCOUNTER — Other Ambulatory Visit: Payer: Self-pay | Admitting: *Deleted

## 2016-10-25 ENCOUNTER — Other Ambulatory Visit: Payer: Self-pay | Admitting: Obstetrics & Gynecology

## 2016-10-25 DIAGNOSIS — N6002 Solitary cyst of left breast: Secondary | ICD-10-CM

## 2016-10-25 NOTE — Telephone Encounter (Addendum)
Message left per DPR for patient with appointment date and time of 6 month ultrasound. [facility] number provided for patient should she need to call and reschedule appointment. Recall in place for 12/2016.  Routing to provider for final review. Patient agreeable to disposition. Will close encounter.

## 2016-10-25 NOTE — Telephone Encounter (Signed)
Call to patient. Message given to patient as seen below from Dr. Hyacinth MeekerMiller. Patient verbalized understanding. Advised patient RN would call for the [facility] for 6 month follow up appointment.

## 2016-10-25 NOTE — Telephone Encounter (Signed)
Spoke with Maralyn SagoSarah at [facility]. Patient scheduled for 6 month follow up ultrasound of left breast on Friday 01/14/17 at 1450.

## 2016-10-25 NOTE — Progress Notes (Signed)
Encounter opened in error

## 2016-12-21 ENCOUNTER — Encounter: Payer: Self-pay | Admitting: Obstetrics & Gynecology

## 2017-01-14 ENCOUNTER — Other Ambulatory Visit: Payer: BC Managed Care – PPO

## 2017-01-17 ENCOUNTER — Ambulatory Visit
Admission: RE | Admit: 2017-01-17 | Discharge: 2017-01-17 | Disposition: A | Discharge status: Home / Self Care | Payer: Federal, State, Local not specified - PPO | Source: Ambulatory Visit | Attending: Obstetrics & Gynecology | Admitting: Obstetrics & Gynecology

## 2017-01-17 DIAGNOSIS — N6002 Solitary cyst of left breast: Secondary | ICD-10-CM

## 2017-06-26 ENCOUNTER — Other Ambulatory Visit: Payer: Self-pay | Admitting: Obstetrics & Gynecology

## 2017-10-03 ENCOUNTER — Other Ambulatory Visit: Payer: Self-pay | Admitting: Obstetrics & Gynecology

## 2017-10-03 DIAGNOSIS — Z1231 Encounter for screening mammogram for malignant neoplasm of breast: Secondary | ICD-10-CM

## 2017-10-07 ENCOUNTER — Ambulatory Visit: Payer: Federal, State, Local not specified - PPO

## 2017-10-24 ENCOUNTER — Ambulatory Visit
Admission: RE | Admit: 2017-10-24 | Discharge: 2017-10-24 | Disposition: A | Discharge status: Home / Self Care | Payer: Federal, State, Local not specified - PPO | Source: Ambulatory Visit | Attending: Obstetrics & Gynecology | Admitting: Obstetrics & Gynecology

## 2017-10-24 DIAGNOSIS — Z1231 Encounter for screening mammogram for malignant neoplasm of breast: Secondary | ICD-10-CM

## 2017-11-14 ENCOUNTER — Encounter

## 2017-11-14 ENCOUNTER — Ambulatory Visit: Payer: Federal, State, Local not specified - PPO | Admitting: Obstetrics and Gynecology

## 2017-11-24 ENCOUNTER — Other Ambulatory Visit: Payer: Self-pay | Admitting: Obstetrics & Gynecology

## 2017-11-24 NOTE — Telephone Encounter (Signed)
Medication refill request: Estradiol Northindrone Last AEX:  10/19/16 Next AEX: 01/27/18 Last MMG (if hormonal medication request): 10/24/17 Bi-rads category 1 neg  Refill authorized: Please refill until AEX if appropriate.

## 2017-12-08 ENCOUNTER — Other Ambulatory Visit: Payer: Self-pay | Admitting: Obstetrics & Gynecology

## 2017-12-08 NOTE — Telephone Encounter (Signed)
Patient calling to check on refill for estradiol. States pharmacy has sent request to us. Only has 3 days left.

## 2017-12-08 NOTE — Telephone Encounter (Signed)
[facility] [address] and they have it on file, was sent 11/25/17.  Message left to return call.

## 2017-12-30 ENCOUNTER — Other Ambulatory Visit: Payer: Self-pay | Admitting: Obstetrics & Gynecology

## 2017-12-30 NOTE — Telephone Encounter (Signed)
Medication refill request:venlafaxine 150mg   Last AEX:  10/19/16 Next AEX: 01/27/18 Last MMG (if hormonal medication request): 10/25/17  Bi-rads 1 neg  Refill authorized: #30 with 0 RF please refill if appropriate

## 2018-01-27 ENCOUNTER — Encounter: Payer: Self-pay | Admitting: Obstetrics & Gynecology

## 2018-01-27 ENCOUNTER — Ambulatory Visit (INDEPENDENT_AMBULATORY_CARE_PROVIDER_SITE_OTHER): Payer: Federal, State, Local not specified - PPO | Admitting: Obstetrics & Gynecology

## 2018-01-27 VITALS — BP 110/64 | HR 66 | Resp 14 | Ht 65.5 in | Wt 152.0 lb

## 2018-01-27 DIAGNOSIS — Z0000 Encounter for general adult medical examination without abnormal findings: Secondary | ICD-10-CM

## 2018-01-27 DIAGNOSIS — R5383 Other fatigue: Secondary | ICD-10-CM

## 2018-01-27 DIAGNOSIS — Z01419 Encounter for gynecological examination (general) (routine) without abnormal findings: Secondary | ICD-10-CM | POA: Diagnosis not present

## 2018-01-27 DIAGNOSIS — R109 Unspecified abdominal pain: Secondary | ICD-10-CM | POA: Diagnosis not present

## 2018-01-27 LAB — POCT URINALYSIS DIPSTICK
Bilirubin, UA: NEGATIVE
GLUCOSE UA: NEGATIVE
Ketones, UA: NEGATIVE
Nitrite, UA: NEGATIVE
Protein, UA: NEGATIVE
UROBILINOGEN UA: NEGATIVE U/dL — AB
pH, UA: 5 (ref 5.0–8.0)

## 2018-01-27 MED ORDER — VENLAFAXINE HCL ER 150 MG PO CP24: 150.0000 mg | ORAL_CAPSULE | Freq: Every day | ORAL | 4 refills | Status: DC

## 2018-01-27 MED ORDER — ESTRADIOL-NORETHINDRONE ACET 0.5-0.1 MG PO TABS: 1.0000 | ORAL_TABLET | Freq: Every day | ORAL | 4 refills | Status: DC

## 2018-01-27 NOTE — Progress Notes (Signed)
57 y.o. [id number] [name] here for annual exam.  Doing well.  Having some lower back pain on her left--describes at soreness only.  Denies dysuria.    Denies vaginal bleeding.    Reports she does have a lot of fatigue that seems to have worsened over the past three months.  Can take a nap every day for two hours.  Feels this is not normal.    Moved into new home that she built just a few weeks ago.  No LMP recorded. Patient has had an ablation.          Sexually active: Yes.    The current method of family planning is Vasectomy.    Exercising: Yes.    walking / summer swimming Smoker:  no  Health Maintenance: Pap:  10/17/15 Neg. HR HPV:neg   History of abnormal Pap:  Yes years ago per patient  MMG:  09/2017 Colonoscopy:  never BMD:   07/30/15 at [facility] in [place]- Osteopenia  TDaP:  2014 Pneumonia vaccine(s):  no Shingrix:   no Hep C testing: 10/19/16 Neg  Screening Labs: yes , Hb today: no, Urine today: yes   reports that she has never smoked. She has never used smokeless tobacco. She reports that she drinks about 1.0 standard drinks of alcohol per week. She reports that she does not use drugs.  Past Medical History:  Diagnosis Date  . Adult acne   . Anxiety   . Fracture of right ankle   . Stomach ulcer     Past Surgical History:  Procedure Laterality Date  . ABLATION  11/2006  . ANKLE FRACTURE SURGERY Right 12/2006  . BREAST LUMPECTOMY  1997   Benign  . JOINT REPLACEMENT    . KNEE ARTHROSCOPY Left 1995  . Osteoma Removal  12/2012    Current Outpatient Medications  Medication Sig Dispense Refill  . ampicillin (PRINCIPEN) 500 MG capsule Take 1 capsule (500 mg total) by mouth daily. 90 capsule 0  . Estradiol-Norethindrone Acet 0.5-0.1 MG tablet TAKE 1 TABLET BY MOUTH EVERY DAY 84 tablet 0  . Multiple Vitamin (MULITIVITAMIN WITH MINERALS) TABS Take 1 tablet by mouth daily.    . multivitamin-lutein (OCUVITE-LUTEIN) CAPS capsule Take 1 capsule by  mouth daily.    Marland Kitchen venlafaxine XR (EFFEXOR-XR) 150 MG 24 hr capsule TAKE 1 CAPSULE (150 MG TOTAL) BY MOUTH DAILY WITH BREAKFAST. 90 capsule 1   No current facility-administered medications for this visit.     Family History  Problem Relation Age of Onset  . Heart attack Maternal Uncle   . Breast cancer Maternal Grandmother     Review of Systems  Genitourinary: Positive for flank pain.    Exam:   There were no vitals taken for this visit.  Height:      Ht Readings from Last 3 Encounters:  10/19/16 5' 5.75" (1.67 m)  10/17/15 5\' 6"  (1.676 m)  09/17/14 5\' 6"  (1.676 m)    General appearance: alert, cooperative and appears stated age Head: Normocephalic, without obvious abnormality, atraumatic Neck: no adenopathy, supple, symmetrical, trachea midline and thyroid normal to inspection and palpation Lungs: clear to auscultation bilaterally Breasts: normal appearance, no masses or tenderness Heart: regular rate and rhythm Abdomen: soft, non-tender; bowel sounds normal; no masses,  no organomegaly Extremities: extremities normal, atraumatic, no cyanosis or edema Skin: Skin color, texture, turgor normal. No rashes or lesions Lymph nodes: Cervical, supraclavicular, and axillary nodes normal. No abnormal inguinal nodes palpated Neurologic: Grossly normal  Pelvic: External genitalia:  no lesions              Urethra:  normal appearing urethra with no masses, tenderness or lesions              Bartholins and Skenes: normal                 Vagina: normal appearing vagina with normal color and discharge, no lesions              Cervix: no lesions              Pap taken: No. Bimanual Exam:  Uterus:  normal size, contour, position, consistency, mobility, non-tender              Adnexa: normal adnexa and no mass, fullness, tenderness               Rectovaginal: Confirms               Anus:  normal sphincter tone, no lesions  Chaperone was present for exam.  A:  Well Woman with normal  exam PMP, no HRT Menopausal acne Osteopenia Fatigue  P:   Mammogram guidelines reviewed.  Doing yearly 3D MMG. pap smear guidelines reviewed.  Pap and neg HR HPV 2017.   Declines colonoscopy.  Pt states she WILL do a cologuard RF for Effexor 150mg  XL daily .  #90/4RF. Activella HS 0.5/0.1mg  daily.  #3 month supply/4RF.  Will start to wean off HRT this year.  Will start by cutting in half.   Lab work obtained:  CBC, CMP, TSH, Vit D and iron levels obtained Urine culture and micro obtained today Return annually or prn

## 2018-01-28 LAB — COMPREHENSIVE METABOLIC PANEL
ALK PHOS: 77 IU/L (ref 39–117)
ALT: 21 IU/L (ref 0–32)
AST: 23 IU/L (ref 0–40)
Albumin/Globulin Ratio: 2.2 (ref 1.2–2.2)
Albumin: 4.4 g/dL (ref 3.5–5.5)
BUN / CREAT RATIO: 14 (ref 9–23)
BUN: 8 mg/dL (ref 6–24)
Bilirubin Total: 0.3 mg/dL (ref 0.0–1.2)
CO2: 25 mmol/L (ref 20–29)
CREATININE: 0.57 mg/dL (ref 0.57–1.00)
Calcium: 8.9 mg/dL (ref 8.7–10.2)
Chloride: 101 mmol/L (ref 96–106)
GFR, EST AFRICAN AMERICAN: 118 mL/min/{1.73_m2} (ref >59)
GFR, EST NON AFRICAN AMERICAN: 103 mL/min/{1.73_m2} (ref >59)
Globulin, Total: 2 g/dL (ref 1.5–4.5)
Glucose: 74 mg/dL (ref 65–99)
Potassium: 4.3 mmol/L (ref 3.5–5.2)
Sodium: 139 mmol/L (ref 134–144)
TOTAL PROTEIN: 6.4 g/dL (ref 6.0–8.5)

## 2018-01-28 LAB — CBC WITH DIFFERENTIAL/PLATELET
BASOS: 1 %
Basophils Absolute: 0.1 10*3/uL (ref 0.0–0.2)
EOS (ABSOLUTE): 0.2 10*3/uL (ref 0.0–0.4)
Eos: 2 %
Hematocrit: 39.3 % (ref 34.0–46.6)
Hemoglobin: 12.9 g/dL (ref 11.1–15.9)
IMMATURE GRANULOCYTES: 0 %
Immature Grans (Abs): 0 10*3/uL (ref 0.0–0.1)
Lymphocytes Absolute: 1.6 10*3/uL (ref 0.7–3.1)
Lymphs: 22 %
MCH: 29.4 pg (ref 26.6–33.0)
MCHC: 32.8 g/dL (ref 31.5–35.7)
MCV: 90 fL (ref 79–97)
MONOS ABS: 0.8 10*3/uL (ref 0.1–0.9)
Monocytes: 11 %
NEUTROS PCT: 64 %
Neutrophils Absolute: 4.8 10*3/uL (ref 1.4–7.0)
Platelets: 439 10*3/uL (ref 150–450)
RBC: 4.39 x10E6/uL (ref 3.77–5.28)
RDW: 12.5 % (ref 12.3–15.4)
WBC: 7.5 10*3/uL (ref 3.4–10.8)

## 2018-01-28 LAB — URINALYSIS, MICROSCOPIC ONLY
BACTERIA UA: NONE SEEN
Casts: NONE SEEN /lpf

## 2018-01-28 LAB — URINE CULTURE: ORGANISM ID, BACTERIA: NO GROWTH

## 2018-01-28 LAB — TSH: TSH: 1.07 u[IU]/mL (ref 0.450–4.500)

## 2018-01-28 LAB — IRON,TIBC AND FERRITIN PANEL
Ferritin: 33 ng/mL (ref 15–150)
IRON SATURATION: 37 % (ref 15–55)
Iron: 101 ug/dL (ref 27–159)
TIBC: 272 ug/dL (ref 250–450)
UIBC: 171 ug/dL (ref 131–425)

## 2018-01-28 LAB — VITAMIN D 25 HYDROXY (VIT D DEFICIENCY, FRACTURES): VIT D 25 HYDROXY: 33.8 ng/mL (ref 30.0–100.0)

## 2018-02-02 ENCOUNTER — Telehealth: Payer: Self-pay | Admitting: *Deleted

## 2018-02-02 NOTE — Telephone Encounter (Signed)
Call to patient. Advised patient per Jinny Blossom at [facility] to dispose of [employer] from last year. Jinny Blossom states kit is attached to Cologuard order and patient will be sent a whole new kit once order received. Patient agreeable.   Encounter closed.

## 2018-02-08 ENCOUNTER — Telehealth: Payer: Self-pay | Admitting: Obstetrics & Gynecology

## 2018-02-08 NOTE — Telephone Encounter (Signed)
Patient states she was to let Dr. Hyacinth MeekerMiller know how tapering off of hormones went. "It went terribly." On day 5, states hot flashes and night sweats returned. She is back to full dose.

## 2018-02-08 NOTE — Telephone Encounter (Signed)
Left detailed message, ok per dpr. Advised message received regarding HRT, will update Dr. Hyacinth MeekerMiller. Advised patient she has current RX on file sent 01/27/18 for Activella. Return call to office if any additional questions/concerns.  Will return call if any additional recommendations from Dr. Hyacinth MeekerMiller.   Routing to provider for final review. Patient is agreeable to disposition. Will close encounter.

## 2018-02-15 ENCOUNTER — Telehealth: Payer: Self-pay | Admitting: Obstetrics & Gynecology

## 2018-02-15 NOTE — Telephone Encounter (Signed)
Message not needed. °

## 2018-02-16 ENCOUNTER — Ambulatory Visit: Payer: Federal, State, Local not specified - PPO | Admitting: Obstetrics & Gynecology

## 2018-02-17 ENCOUNTER — Encounter: Payer: Self-pay | Admitting: Obstetrics & Gynecology

## 2018-02-17 ENCOUNTER — Other Ambulatory Visit: Payer: Self-pay

## 2018-02-17 ENCOUNTER — Ambulatory Visit (INDEPENDENT_AMBULATORY_CARE_PROVIDER_SITE_OTHER): Payer: Federal, State, Local not specified - PPO | Admitting: Obstetrics & Gynecology

## 2018-02-17 VITALS — BP 110/70 | HR 80 | Temp 97.9°F | Ht 65.5 in | Wt 155.0 lb

## 2018-02-17 DIAGNOSIS — N898 Other specified noninflammatory disorders of vagina: Secondary | ICD-10-CM

## 2018-02-17 DIAGNOSIS — R300 Dysuria: Secondary | ICD-10-CM

## 2018-02-17 LAB — POCT URINALYSIS DIPSTICK
Bilirubin, UA: NEGATIVE
Glucose, UA: NEGATIVE
KETONES UA: NEGATIVE
NITRITE UA: NEGATIVE
PROTEIN UA: NEGATIVE
Urobilinogen, UA: 0.2 E.U./dL
pH, UA: 5 (ref 5.0–8.0)

## 2018-02-17 MED ORDER — PHENAZOPYRIDINE HCL 95 MG PO TABS: 95.0000 mg | ORAL_TABLET | Freq: Three times a day (TID) | ORAL | 0 refills | Status: DC | PRN

## 2018-02-17 MED ORDER — SULFAMETHOXAZOLE-TRIMETHOPRIM 800-160 MG PO TABS: 1.0000 | ORAL_TABLET | Freq: Two times a day (BID) | ORAL | 0 refills | Status: DC

## 2018-02-17 NOTE — Progress Notes (Signed)
GYNECOLOGY  VISIT  CC:   Possible uti?  HPI: 57 y.o. G37P3003 [name] here for urinary frequency/urgency/dysuria. She also complains of left flank pain. Patient states seen at Urgent care last weekend and treated for UTI with Macrobid--they forgot to send culture. Patient went back 02-15-18 and gave another urine for culture and hasn't heard from urgent care yet.  Was placed on Ciprofloxin at the time.  Has taken just a few .  Complains for urinary frequency and urgency as well as dysuria.  Is having some left lower back pain.  Denies fever.  Denies blood in urine.    Urgent care results can be seen in Care Everywhere and are negative.  Sexually active.  No new partners.  GYNECOLOGIC HISTORY: No LMP recorded. Patient has had an ablation. Contraception:Vasectomy/ablation Menopausal hormone therapy: Estradiol-Norethindrone 0.5/0.1  Patient Active Problem List   Diagnosis Date Noted  . Hip pain 09/18/2014  . Muscle pain 09/18/2014  . Menopausal symptom 09/18/2014    Past Medical History:  Diagnosis Date  . Adult acne   . Anxiety   . Fracture of right ankle   . Stomach ulcer     Past Surgical History:  Procedure Laterality Date  . ABLATION  11/2006  . ANKLE FRACTURE SURGERY Right 12/2006  . BREAST LUMPECTOMY  1997   Benign  . JOINT REPLACEMENT    . KNEE ARTHROSCOPY Left 1995  . Osteoma Removal  12/2012    MEDS:   Current Outpatient Medications on File Prior to Visit  Medication Sig Dispense Refill  . ampicillin (PRINCIPEN) 500 MG capsule Take 1 capsule (500 mg total) by mouth daily. 90 capsule 0  . ciprofloxacin (CIPRO) 500 MG tablet Take 1 tablet by mouth 2 (two) times daily.    . Estradiol-Norethindrone Acet 0.5-0.1 MG tablet Take 1 tablet by mouth daily. 84 tablet 4  . fluconazole (DIFLUCAN) 150 MG tablet     . multivitamin-lutein (OCUVITE-LUTEIN) CAPS capsule Take 1 capsule by mouth daily.    Marland Kitchen venlafaxine XR (EFFEXOR-XR) 150 MG 24 hr capsule  Take 1 capsule (150 mg total) by mouth daily with breakfast. 90 capsule 4  . nitrofurantoin, macrocrystal-monohydrate, (MACROBID) 100 MG capsule      No current facility-administered medications on file prior to visit.     ALLERGIES: Bee venom  Family History  Problem Relation Age of Onset  . Heart attack Maternal Uncle   . Breast cancer Maternal Grandmother     SH:  Married, non smoker  Review of Systems  Genitourinary: Positive for dysuria, frequency and urgency.       Left flank pain  All other systems reviewed and are negative.   PHYSICAL EXAMINATION:    BP 110/70 (BP Location: Right Arm, Patient Position: Sitting, Cuff Size: Normal)   Pulse 80   Temp 97.9 F (36.6 C) (Oral)   Ht 5' 5.5" (1.664 m)   Wt 155 lb (70.3 kg)   BMI 25.40 kg/m     General appearance: alert, cooperative and appears stated age CV:  Regular rate and rhythm Lungs:  clear to auscultation, no wheezes, rales or rhonchi, symmetric air entry Flank:  No CVA tenderness Abdomen: soft, non-tender; bowel sounds normal; no masses,  no organomegaly Lymph:  no inguinal LAD noted  Pelvic: External genitalia:  no lesions              Urethra:  normal appearing urethra with no masses, tenderness or lesions  Bartholins and Skenes: normal                 Vagina: normal appearing vagina with normal color and discharge, no lesions              Cervix: no lesions              Bimanual Exam:  Uterus:  normal size, contour, position, consistency, mobility, non-tender              Adnexa: no mass, fullness, tenderness  Chaperone was present for exam.  Assessment: Dysuria with urgency, frequency and dysuria Microscopic hematuria  Plan: As pt is doubtful of results from Urgency care will switch antibiotics to bactrim DS BID x 3 days.  If this does not fix symptoms and urine culture is negative, additional evaluation is going to be needed. Vaginitis testing obtained today as well.

## 2018-02-18 LAB — VAGINITIS/VAGINOSIS, DNA PROBE
Candida Species: NEGATIVE
GARDNERELLA VAGINALIS: NEGATIVE
Trichomonas vaginosis: NEGATIVE

## 2018-02-19 LAB — URINE CULTURE: Organism ID, Bacteria: NO GROWTH

## 2018-02-20 ENCOUNTER — Telehealth: Payer: Self-pay | Admitting: Obstetrics & Gynecology

## 2018-02-20 ENCOUNTER — Telehealth: Payer: Self-pay | Admitting: *Deleted

## 2018-02-20 NOTE — Telephone Encounter (Signed)
Opened in error. Encounter closed.

## 2018-02-20 NOTE — Telephone Encounter (Signed)
Actually, I'd like her to return for a repeat urine micro to make sure the blood has resolved from her urine.  Not urgent but I'd like this done in the next few weeks if possible.  Thanks.

## 2018-02-20 NOTE — Telephone Encounter (Signed)
Patient calling requesting results from Friday.

## 2018-02-20 NOTE — Telephone Encounter (Signed)
Spoke with patient, advised as seen below per Dr. Hyacinth MeekerMiller. Patient reports all symptoms have resolved. No orders placed for urology. Patient aware to return call if symptoms return or with any additional questions.   Routing to provider for final review. Patient is agreeable to disposition. Will close encounter.

## 2018-02-20 NOTE — Telephone Encounter (Signed)
-----   Message from [name], MD sent at 02/20/2018 12:15 AM EST ----- Please let pt know urine culture was negative and vaginitis testing was negative.  I think needs urology evaluation if symptoms are still present but I'm not sure how fast we can get a urology referral for her.

## 2018-02-20 NOTE — Telephone Encounter (Signed)
Spoke with patient, advised as seen below per Dr. Hyacinth MeekerMiller. Nurse visit scheduled for 02/27/18 at 2:30pm.   Routing to provider for final review. Patient is agreeable to disposition. Will close encounter.

## 2018-02-27 ENCOUNTER — Ambulatory Visit (INDEPENDENT_AMBULATORY_CARE_PROVIDER_SITE_OTHER): Payer: Federal, State, Local not specified - PPO

## 2018-02-27 VITALS — BP 120/74 | HR 66 | Resp 14 | Ht 65.5 in | Wt 159.2 lb

## 2018-02-27 DIAGNOSIS — R300 Dysuria: Secondary | ICD-10-CM | POA: Diagnosis not present

## 2018-02-27 NOTE — Progress Notes (Signed)
Patient is here for a repeat urin micro. Patient denies any symptoms. She finished the course of bactrim.   Sending to provider for final review.

## 2018-02-28 LAB — URINALYSIS, MICROSCOPIC ONLY
Bacteria, UA: NONE SEEN
CASTS: NONE SEEN /LPF

## 2018-04-08 LAB — COLOGUARD: Cologuard: NEGATIVE

## 2018-05-01 ENCOUNTER — Encounter: Payer: Self-pay | Admitting: Obstetrics & Gynecology

## 2018-05-02 ENCOUNTER — Telehealth: Payer: Self-pay | Admitting: Obstetrics & Gynecology

## 2018-05-02 NOTE — Telephone Encounter (Signed)
Responded to patient via mychart. Cologuard was negative.  Advised may take a few weeks for scanning into chart.  Encounter closed.

## 2018-05-02 NOTE — Telephone Encounter (Signed)
Patient sent the following correspondence through MyChart. Routing to triage to assist patient with request.  Hello. I got a letter from [facility] stating that the test result is available. However, I dont know where to access it. Can you advise, please?  Thanks so much,  Noreene Larsson T

## 2018-09-11 ENCOUNTER — Telehealth: Payer: Self-pay | Admitting: Obstetrics & Gynecology

## 2018-09-11 ENCOUNTER — Encounter: Payer: Self-pay | Admitting: Obstetrics & Gynecology

## 2018-09-11 NOTE — Telephone Encounter (Signed)
Patient sent the following correspondence through [facility]. Routing to triage to assist patient with request.  Hi. At my last appointment with Dr. Sabra Heck, we discussed the shingles vaccine. Even though Im a needle-phobic I do know this is important. So, Id like to request a sedative (I did talk to Dr. Sabra Heck about this also) to take at the time of my vaccinations. I am aware that I cant drive while taking it, so my husband will drive and we both will be vaccinated. If she still agrees, [facility] [address] [place], [place] [address],   [phone].  Thanks so much,  Gloria Rivera

## 2018-09-11 NOTE — Telephone Encounter (Signed)
Called patient and left message for her to return my call. 

## 2018-09-12 MED ORDER — DIAZEPAM 2 MG PO TABS: ORAL_TABLET | ORAL | 0 refills | Status: DC

## 2018-09-12 NOTE — Telephone Encounter (Signed)
Patient notified. Encounter closed

## 2018-09-12 NOTE — Telephone Encounter (Signed)
OK to use valium 2mg  po x 1, repeat in 1 hour if needed.  Rx sent to pharmacy on file for pt.

## 2018-09-12 NOTE — Telephone Encounter (Signed)
Dr. Sabra Heck -please advise on anti-anxiety medication for Shingrix vaccine.   Pharmacy updated.

## 2018-09-12 NOTE — Telephone Encounter (Signed)
Patient is returning call to Amanda. °

## 2019-02-01 ENCOUNTER — Other Ambulatory Visit: Payer: Self-pay

## 2019-02-02 ENCOUNTER — Ambulatory Visit: Payer: Federal, State, Local not specified - PPO | Admitting: Obstetrics & Gynecology

## 2019-03-15 ENCOUNTER — Other Ambulatory Visit: Payer: Self-pay

## 2019-03-19 ENCOUNTER — Encounter: Payer: Self-pay | Admitting: Obstetrics & Gynecology

## 2019-03-19 ENCOUNTER — Ambulatory Visit (INDEPENDENT_AMBULATORY_CARE_PROVIDER_SITE_OTHER): Payer: Federal, State, Local not specified - PPO | Admitting: Obstetrics & Gynecology

## 2019-03-19 ENCOUNTER — Other Ambulatory Visit: Payer: Self-pay

## 2019-03-19 VITALS — BP 122/70 | HR 80 | Temp 97.5°F | Resp 12 | Ht 65.5 in | Wt 166.0 lb

## 2019-03-19 DIAGNOSIS — Z01419 Encounter for gynecological examination (general) (routine) without abnormal findings: Secondary | ICD-10-CM

## 2019-03-19 DIAGNOSIS — L292 Pruritus vulvae: Secondary | ICD-10-CM

## 2019-03-19 DIAGNOSIS — Z0000 Encounter for general adult medical examination without abnormal findings: Secondary | ICD-10-CM

## 2019-03-19 MED ORDER — ESTRADIOL-NORETHINDRONE ACET 0.5-0.1 MG PO TABS: 1.0000 | ORAL_TABLET | Freq: Every day | ORAL | 4 refills | Status: DC

## 2019-03-19 MED ORDER — VENLAFAXINE HCL ER 150 MG PO CP24: 150.0000 mg | ORAL_CAPSULE | Freq: Every day | ORAL | 4 refills | Status: DC

## 2019-03-19 NOTE — Progress Notes (Signed)
58 y.o. G73P3003 [name] here for annual exam. Patient complains of having vulvar itching that started about three or four months ago.  She is only having this externally.  She has tried to figure out if there are any associations.  She is using topical OTC antifungal.    Denies vaginal bleeding.    Declines flu shot.  Is planning on having the shingrix vaccination.    Having increased issues with urinary leakage.  Having this with laughing, cough, sneezing, bending ovary.   Has gained 14 pounds this year.  She is frustrated with this.  She wakes up with morning headaches.  No LMP recorded. Patient has had an ablation.          Sexually active: Yes.    The current method of family planning is ablation.    Exercising: No.  The patient does not participate in regular exercise at present. Smoker:  no  Health Maintenance: Pap:  10/17/15 Neg. HR HPV:neg   History of abnormal Pap:  Yes, years ago per patient MMG:  10/24/17 BIRADS 1 negative/density b Colonoscopy:  Reports cologuard was negative in early 2020 BMD:   07/30/15 Osteopenia TDaP:  2014 Pneumonia vaccine(s):  no Shingrix:  Has decided to proceed with this Hep C testing: 10/19/16 Neg Screening Labs: 01/2018   reports that she has never smoked. She has never used smokeless tobacco. She reports current alcohol use of about 1.0 standard drinks of alcohol per week. She reports that she does not use drugs.  Past Medical History:  Diagnosis Date  . Adult acne   . Anxiety   . Fracture of right ankle   . Stomach ulcer     Past Surgical History:  Procedure Laterality Date  . ABLATION  11/2006  . ANKLE FRACTURE SURGERY Right 12/2006  . BREAST LUMPECTOMY  1997   Benign  . JOINT REPLACEMENT    . KNEE ARTHROSCOPY Left 1995  . Osteoma Removal  12/2012    Current Outpatient Medications  Medication Sig Dispense Refill  . ampicillin (PRINCIPEN) 500 MG capsule Take 1 capsule (500 mg total) by mouth daily. 90  capsule 0  . Estradiol-Norethindrone Acet 0.5-0.1 MG tablet Take 1 tablet by mouth daily. 84 tablet 4  . multivitamin-lutein (OCUVITE-LUTEIN) CAPS capsule Take 1 capsule by mouth daily.    Marland Kitchen venlafaxine XR (EFFEXOR-XR) 150 MG 24 hr capsule Take 1 capsule (150 mg total) by mouth daily with breakfast. 90 capsule 4  . diazepam (VALIUM) 2 MG tablet 1 po 2 hours before procedure/injection.  Can repeat in 1 hour if needed. (Patient not taking: Reported on 03/19/2019) 2 tablet 0   No current facility-administered medications for this visit.    Family History  Problem Relation Age of Onset  . Heart attack Maternal Uncle   . Breast cancer Maternal Grandmother     Review of Systems  Constitutional:       Weight gain Craving sweets  Genitourinary:       Vulvar itching  All other systems reviewed and are negative.   Exam:   BP 122/70 (BP Location: Right Arm, Patient Position: Sitting, Cuff Size: Normal)   Pulse 80   Temp (!) 97.5 F (36.4 C) (Temporal)   Resp 12   Ht 5' 5.5" (1.664 m)   Wt 166 lb (75.3 kg)   BMI 27.20 kg/m   Height: 5' 5.5" (166.4 cm)  Ht Readings from Last 3 Encounters:  03/19/19 5' 5.5" (1.664 m)  02/27/18  5' 5.5" (1.664 m)  02/17/18 5' 5.5" (1.664 m)   General appearance: alert, cooperative and appears stated age Head: Normocephalic, without obvious abnormality, atraumatic Neck: no adenopathy, supple, symmetrical, trachea midline and thyroid normal to inspection and palpation Lungs: clear to auscultation bilaterally Breasts: normal appearance, no masses or tenderness Heart: regular rate and rhythm Abdomen: soft, non-tender; bowel sounds normal; no masses,  no organomegaly Extremities: extremities normal, atraumatic, no cyanosis or edema Skin: Skin color, texture, turgor normal. No rashes or lesions Lymph nodes: Cervical, supraclavicular, and axillary nodes normal. No abnormal inguinal nodes palpated Neurologic: Grossly normal   Pelvic: External genitalia:   no lesions              Urethra:  normal appearing urethra with no masses, tenderness or lesions              Bartholins and Skenes: normal                 Vagina: normal appearing vagina with normal color and discharge, no lesions              Cervix: no lesions              Pap taken: No. Bimanual Exam:  Uterus:  normal size, contour, position, consistency, mobility, non-tender              Adnexa: normal adnexa and no mass, fullness, tenderness               Rectovaginal: Confirms               Anus:  normal sphincter tone, no lesions  Chaperone, Terence Lux, CMA, was present for exam.  A:  Well Woman with normal exam PMP, on HRT SUI Osteopenia Vulvar itching  P:   Mammogram guidelines reviewed.  She is aware this is due.    pap smear and neg HR HPV 2017.  Guidelines reviewed.   Never received Cologuard results.  Will call for these.  (Pt reports this was negative.) RF for Effexor 150mg  XL daily.  #90/4RF Activella HS 0.5/0.1mg  daily.  #3 month supply/4RF.  (Tried to wean last year but had worsening hot flashes very quickly) RF for Venlafaxine XR 150mg  daily.  #90/4RF Lab work done 2019 Desires evaluation for ADD/ADHD.  Amy Stevenson's name given.  She knows to call if needs referral. Affirm obtained today. Return annually or prn

## 2019-03-19 NOTE — Patient Instructions (Addendum)
Outpatient Pharmacy at [facility] [place],  [place]  [id number]  Main: [phone]  [facility] [phone] [place], [place] [address]  Phone: [phone] Fax: [phone]  Hours of Operation Monday to Friday 8:00 AM - 5:00 PM Saturday and Sunday: Closed

## 2019-03-20 ENCOUNTER — Other Ambulatory Visit: Payer: Self-pay

## 2019-03-20 LAB — COMPREHENSIVE METABOLIC PANEL
ALT: 20 IU/L (ref 0–32)
AST: 23 IU/L (ref 0–40)
Albumin/Globulin Ratio: 2 (ref 1.2–2.2)
Albumin: 4.4 g/dL (ref 3.8–4.9)
Alkaline Phosphatase: 110 IU/L (ref 39–117)
BUN/Creatinine Ratio: 15 (ref 9–23)
BUN: 8 mg/dL (ref 6–24)
Bilirubin Total: <0.2 mg/dL (ref 0.0–1.2)
CO2: 26 mmol/L (ref 20–29)
Calcium: 9.3 mg/dL (ref 8.7–10.2)
Chloride: 101 mmol/L (ref 96–106)
Creatinine, Ser: 0.54 mg/dL — ABNORMAL LOW (ref 0.57–1.00)
GFR calc Af Amer: 119 mL/min/{1.73_m2} (ref >59)
GFR calc non Af Amer: 104 mL/min/{1.73_m2} (ref >59)
Globulin, Total: 2.2 g/dL (ref 1.5–4.5)
Glucose: 79 mg/dL (ref 65–99)
Potassium: 4.7 mmol/L (ref 3.5–5.2)
Sodium: 141 mmol/L (ref 134–144)
Total Protein: 6.6 g/dL (ref 6.0–8.5)

## 2019-03-20 LAB — CBC
Hematocrit: 38.5 % (ref 34.0–46.6)
Hemoglobin: 12.8 g/dL (ref 11.1–15.9)
MCH: 30.1 pg (ref 26.6–33.0)
MCHC: 33.2 g/dL (ref 31.5–35.7)
MCV: 91 fL (ref 79–97)
Platelets: 417 10*3/uL (ref 150–450)
RBC: 4.25 x10E6/uL (ref 3.77–5.28)
RDW: 12 % (ref 11.7–15.4)
WBC: 7.6 10*3/uL (ref 3.4–10.8)

## 2019-03-20 LAB — VAGINITIS/VAGINOSIS, DNA PROBE
Candida Species: NEGATIVE
Gardnerella vaginalis: POSITIVE — AB
Trichomonas vaginosis: NEGATIVE

## 2019-03-20 LAB — LIPID PANEL
Chol/HDL Ratio: 2.6 ratio (ref 0.0–4.4)
Cholesterol, Total: 131 mg/dL (ref 100–199)
HDL: 51 mg/dL (ref >39)
LDL Chol Calc (NIH): 67 mg/dL (ref 0–99)
Triglycerides: 62 mg/dL (ref 0–149)
VLDL Cholesterol Cal: 13 mg/dL (ref 5–40)

## 2019-03-20 LAB — TSH: TSH: 1.1 u[IU]/mL (ref 0.450–4.500)

## 2019-03-20 MED ORDER — METRONIDAZOLE 500 MG PO TABS: 500.0000 mg | ORAL_TABLET | Freq: Two times a day (BID) | ORAL | 0 refills | Status: AC

## 2019-03-21 ENCOUNTER — Telehealth: Payer: Self-pay

## 2019-03-21 NOTE — Telephone Encounter (Signed)
Patient has been notified of negative Cologuard results. Patient will repeat in 3 years per Dr. Sabra Heck. Closing encounter.

## 2019-03-27 ENCOUNTER — Telehealth: Payer: Self-pay | Admitting: *Deleted

## 2019-03-27 ENCOUNTER — Other Ambulatory Visit: Payer: Self-pay | Admitting: Obstetrics & Gynecology

## 2019-03-27 ENCOUNTER — Encounter: Payer: Self-pay | Admitting: Obstetrics & Gynecology

## 2019-03-27 MED ORDER — FLUCONAZOLE 150 MG PO TABS: ORAL_TABLET | ORAL | 0 refills | Status: DC

## 2019-03-27 NOTE — Progress Notes (Signed)
Diflucan rx sent to pharmacy.

## 2019-03-27 NOTE — Telephone Encounter (Signed)
[facility], [facility]  Phone Number: [phone]  Hi. I've developed a yeast infection from the current antibiotic, Metronidazole (when it rains, it pours). Can you please prescribe the Diflucan pill for me? I know I can also use over-the-counter Monostat, but the pill is less expensive and much less gross. Thanks so much. Happy New Year!

## 2019-03-27 NOTE — Telephone Encounter (Signed)
Left detailed message on mobile number per DPR. Pt to call back on 12/30 if have any questions.   Will route to Dr Sabra Heck for final review and will close encounter.

## 2019-03-27 NOTE — Telephone Encounter (Signed)
Rx for diflucan sent to pharmacy on file.  150mg  po x 1, repeat 72 hours if not resolved.  Please let pt know rx has been done.  Thanks.

## 2019-03-27 NOTE — Telephone Encounter (Signed)
Spoke with patient. Patient currently being tx for BV with Flagyl, has taken 6 days of medication. Reports external and internal vaginal itching and white vaginal d/c, no odor. Symptoms started on 03/26/19. Patient is requesting Rx for diflucan. Recommended OTC monistat first, patient declines, stating it is too messy and more expensive.   Advised patient I will review with Dr. Sabra Heck and return call. Patient is agreeable. Confirmed pharmacy.   Rx pended.   Dr. Sabra Heck -please advise on diflucan.

## 2019-04-13 ENCOUNTER — Telehealth: Payer: Self-pay | Admitting: Obstetrics & Gynecology

## 2019-04-13 NOTE — Telephone Encounter (Signed)
Patient received cologuard kit in the mail today and she had done last year.

## 2019-04-13 NOTE — Telephone Encounter (Signed)
Spoke with patient. Patient performed Cologuard testing in 03/2018. Advised I spoke with Cologuard and results are negative. Requested a copy of results for the patient chart. Patient verbalizes understanding. Aware she can dispose of the kit that was mailed to her today as she does not need repeat testing for another 2 years. Patient is agreeable.  Routing to provider and will close encounter.

## 2019-06-01 ENCOUNTER — Ambulatory Visit: Payer: Federal, State, Local not specified - PPO | Admitting: Obstetrics & Gynecology

## 2019-07-20 ENCOUNTER — Ambulatory Visit (INDEPENDENT_AMBULATORY_CARE_PROVIDER_SITE_OTHER): Payer: BC Managed Care – PPO | Admitting: Sports Medicine

## 2019-07-20 ENCOUNTER — Other Ambulatory Visit: Payer: Self-pay

## 2019-07-20 ENCOUNTER — Ambulatory Visit (INDEPENDENT_AMBULATORY_CARE_PROVIDER_SITE_OTHER): Payer: BC Managed Care – PPO

## 2019-07-20 ENCOUNTER — Encounter: Payer: Self-pay | Admitting: Sports Medicine

## 2019-07-20 DIAGNOSIS — M19041 Primary osteoarthritis, right hand: Secondary | ICD-10-CM

## 2019-07-20 DIAGNOSIS — M19042 Primary osteoarthritis, left hand: Secondary | ICD-10-CM

## 2019-07-20 MED ORDER — CELECOXIB 200 MG PO CAPS: ORAL_CAPSULE | ORAL | 2 refills | Status: DC

## 2019-07-20 NOTE — Progress Notes (Signed)
    Procedures performed today:    None.  Independent interpretation of notes and tests performed by another provider:   None.  Brief History, Exam, Impression, and Recommendations:    Primary osteoarthritis of both hands This is a pleasant 59 year old female, she has had a fairly long history of pain both hands, associated with significant gelling in the mornings. On exam she has tenderness at the PIPs and DIPs with Bouchard and Heberden nodes. I explained the natural history of osteoarthritis and the incurable nature. We discussed that we would more be managing her symptoms. Adding Celebrex, bilateral hand x-rays, hand home rehabilitation and physical therapy exercises. She can ice her hands 5-10 minutes 3-4 times a day. Return to see me in 4 to 6 weeks, we can consider interphalangeal joint injections with ultrasound guidance if no better. At that point we may also pull the trigger for rheumatoid arthritis testing.  She does plan to establish primary care with one of my partners.    ___________________________________________ Ihor Austin. Benjamin Stain, M.D., ABFM., CAQSM. Primary Care and Sports Medicine [facility]  Adjunct Instructor of Family Medicine  [employer]

## 2019-07-20 NOTE — Assessment & Plan Note (Addendum)
This is a pleasant 59 year old female, she has had a fairly long history of pain both hands, associated with significant gelling in the mornings. On exam she has tenderness at the PIPs and DIPs with Bouchard and Heberden nodes. I explained the natural history of osteoarthritis and the incurable nature. We discussed that we would more be managing her symptoms. Adding Celebrex, bilateral hand x-rays, hand home rehabilitation and physical therapy exercises. She can ice her hands 5-10 minutes 3-4 times a day. Return to see me in 4 to 6 weeks, we can consider interphalangeal joint injections with ultrasound guidance if no better. At that point we may also pull the trigger for rheumatoid arthritis testing.  She does plan to establish primary care with one of my partners.

## 2019-08-07 ENCOUNTER — Encounter: Payer: Self-pay | Admitting: Osteopathic Medicine

## 2019-08-07 ENCOUNTER — Ambulatory Visit (INDEPENDENT_AMBULATORY_CARE_PROVIDER_SITE_OTHER): Payer: BC Managed Care – PPO | Admitting: Osteopathic Medicine

## 2019-08-07 ENCOUNTER — Other Ambulatory Visit: Payer: Self-pay

## 2019-08-07 VITALS — BP 126/79 | HR 77 | Temp 97.8°F | Ht 66.0 in | Wt 162.0 lb

## 2019-08-07 DIAGNOSIS — F988 Other specified behavioral and emotional disorders with onset usually occurring in childhood and adolescence: Secondary | ICD-10-CM

## 2019-08-07 DIAGNOSIS — F40298 Other specified phobia: Secondary | ICD-10-CM

## 2019-08-07 DIAGNOSIS — Z23 Encounter for immunization: Secondary | ICD-10-CM | POA: Diagnosis not present

## 2019-08-07 MED ORDER — LISDEXAMFETAMINE DIMESYLATE 60 MG PO CAPS: 60.0000 mg | ORAL_CAPSULE | ORAL | 0 refills | Status: DC

## 2019-08-07 MED ORDER — DIAZEPAM 10 MG PO TABS: 5.0000 mg | ORAL_TABLET | Freq: Two times a day (BID) | ORAL | 1 refills | Status: DC | PRN

## 2019-08-07 NOTE — Progress Notes (Signed)
Gloria Rivera is a 59 y.o. female who presents to  [facility] at [facility]  today, 08/07/19, seeking care for the following: . ADHD - 10, then 20, then 30 mg Adderall XR not effective. Never been on alternative.  . Labs reciewed 03/19/2019 were ok CBC CMP TSH Lipid      ASSESSMENT & PLAN with other pertinent history/findings:  The primary encounter diagnosis was Attention deficit disorder (ADD) in adult. Diagnoses of Need for shingles vaccine and Needle phobia were also pertinent to this visit.     Orders Placed This Encounter  Procedures  . Varicella-zoster vaccine IM (Shingrix)    Meds ordered this encounter  Medications  . diazepam (VALIUM) 10 MG tablet    Sig: Take 0.5-1 tablets (5-10 mg total) by mouth every 12 (twelve) hours as needed for anxiety (prior to COVID vaccine).    Dispense:  1 tablet    Refill:  1  . lisdexamfetamine (VYVANSE) 60 MG capsule    Sig: Take 1 capsule (60 mg total) by mouth every morning.    Dispense:  30 capsule    Refill:  0    If PA required - has tried/failed Adderall 10, 20, 30 mg       Follow-up instructions: Return in about 4 weeks (around 09/04/2019) for RECHECK ADHD ON NEW MEDICATION - OFFICE OR VIRTUAL.                                         BP 126/79 (BP Location: Left Arm, Patient Position: Sitting, Cuff Size: Normal)   Pulse 77   Temp 97.8 F (36.6 C) (Oral)   Ht 5\' 6"  (1.676 m)   Wt 162 lb 0.6 oz (73.5 kg)   BMI 26.15 kg/m   Current Meds  Medication Sig  . ampicillin (PRINCIPEN) 500 MG capsule Take 500 mg by mouth.  . celecoxib (CELEBREX) 200 MG capsule One to 2 tablets by mouth daily as needed for pain.  . Estradiol-Norethindrone Acet 0.5-0.1 MG tablet Take 1 tablet by mouth daily.  . multivitamin-lutein (OCUVITE-LUTEIN) CAPS capsule Take 1 capsule by mouth daily.  venlafaxine XR (EFFEXOR-XR) 150 MG 24 hr capsule Take 1 capsule (150  mg total) by mouth daily with breakfast.  . [DISCONTINUED] amphetamine-dextroamphetamine (ADDERALL XR) 30 MG 24 hr capsule Take 30 mg by mouth every morning.    No results found for this or any previous visit (from the past 72 hour(s)).  No results found.  Depression screen [employer] 2/9 08/07/2019  Decreased Interest 0  Down, Depressed, Hopeless 0  PHQ - 2 Score 0  Altered sleeping 0  Tired, decreased energy 0  Change in appetite 0  Feeling bad or failure about yourself  0  Trouble concentrating 2  Moving slowly or fidgety/restless 2  Suicidal thoughts 0  PHQ-9 Score 4  Difficult doing work/chores Somewhat difficult    GAD 7 : Generalized Anxiety Score 08/07/2019  Nervous, Anxious, on Edge 0  Control/stop worrying 1  Worry too much - different things 1  Trouble relaxing 1  Restless 2  Easily annoyed or irritable 1  Afraid - awful might happen 0  Total GAD 7 Score 6  Anxiety Difficulty Somewhat difficult      All questions at time of visit were answered - patient instructed to contact office with any additional concerns or updates.  ER/RTC precautions were reviewed with the patient.  Please note: voice recognition software was used to produce this document, and typos may escape review. Please contact Dr. Sheppard Coil for any needed clarifications.

## 2019-08-10 ENCOUNTER — Telehealth: Payer: Self-pay

## 2019-08-10 NOTE — Telephone Encounter (Signed)
Pt called stating that she was informed by the pharmacy that prior authorization was sent twice to the clinic for vyvanse. Pt was requesting an update as to why prior authorization process was not initiated. Explained to pt no request was rec'd from the pharmacy. Forwarding to coordinator to expedite prior authorization for vyvanse. As per provider, "if PA required - has tried/failed Adderall 10, 20, 30 mg". Pt was informed of clinic process for completing pa.

## 2019-08-13 ENCOUNTER — Telehealth: Payer: Self-pay | Admitting: Osteopathic Medicine

## 2019-08-13 NOTE — Telephone Encounter (Signed)
Received fax for PA on Vyvanse sent through cover my meds waiting on determination. - CF 

## 2019-08-14 NOTE — Telephone Encounter (Signed)
Received fax from [facility] and they approved coverage on Vyvanse from 08/13/2019 - 08/13/2022. - CF

## 2019-08-14 NOTE — Telephone Encounter (Signed)
Received PA from pharmacy and sent through cover my meds - CF

## 2019-08-17 ENCOUNTER — Ambulatory Visit (INDEPENDENT_AMBULATORY_CARE_PROVIDER_SITE_OTHER): Payer: BC Managed Care – PPO | Admitting: Sports Medicine

## 2019-08-17 ENCOUNTER — Other Ambulatory Visit: Payer: Self-pay

## 2019-08-17 ENCOUNTER — Encounter: Payer: Self-pay | Admitting: Sports Medicine

## 2019-08-17 DIAGNOSIS — M2550 Pain in unspecified joint: Secondary | ICD-10-CM | POA: Diagnosis not present

## 2019-08-17 DIAGNOSIS — M19041 Primary osteoarthritis, right hand: Secondary | ICD-10-CM | POA: Diagnosis not present

## 2019-08-17 DIAGNOSIS — M19042 Primary osteoarthritis, left hand: Secondary | ICD-10-CM

## 2019-08-17 MED ORDER — TRAMADOL HCL 50 MG PO TABS
50.0000 mg | ORAL_TABLET | Freq: Three times a day (TID) | ORAL | 0 refills | Status: DC | PRN
Start: 2019-08-17 — End: 2019-10-30

## 2019-08-17 NOTE — Assessment & Plan Note (Signed)
Gloria Rivera returns, she has polyarthralgia, primarily on the small joints of the fingers. She does have a family member with suspected lupus. We tried Celebrex without much improvement, she has done hand therapy, cryotherapy, all with persistent discomfort. Switching to tramadol to be used up to 3 times daily, I am going to pull the trigger for rheumatoid testing today. Return to see me in a month.  She is very hesitant to do injections due to needle phobia.

## 2019-08-17 NOTE — Progress Notes (Signed)
    Procedures performed today:    None.  Independent interpretation of notes and tests performed by another provider:   None.  Brief History, Exam, Impression, and Recommendations:    Primary osteoarthritis of both hands Gloria Rivera returns, she has polyarthralgia, primarily on the small joints of the fingers. She does have a family member with suspected lupus. We tried Celebrex without much improvement, she has done hand therapy, cryotherapy, all with persistent discomfort. Switching to tramadol to be used up to 3 times daily, I am going to pull the trigger for rheumatoid testing today. Return to see me in a month.  She is very hesitant to do injections due to needle phobia.    ___________________________________________ Gloria Rivera. Gloria Rivera, M.D., ABFM., CAQSM. Primary Care and Sports Medicine [facility]  Adjunct Instructor of Family Medicine  [employer]

## 2019-08-23 LAB — CBC WITH DIFFERENTIAL/PLATELET
Absolute Monocytes: 886 cells/uL (ref 200–950)
Basophils Absolute: 52 cells/uL (ref 0–200)
Basophils Relative: 0.6 %
Eosinophils Absolute: 129 cells/uL (ref 15–500)
Eosinophils Relative: 1.5 %
HCT: 38.9 % (ref 35.0–45.0)
Hemoglobin: 12.9 g/dL (ref 11.7–15.5)
Lymphs Abs: 1892 cells/uL (ref 850–3900)
MCH: 29.9 pg (ref 27.0–33.0)
MCHC: 33.2 g/dL (ref 32.0–36.0)
MCV: 90 fL (ref 80.0–100.0)
MPV: 9.6 fL (ref 7.5–12.5)
Monocytes Relative: 10.3 %
Neutro Abs: 5642 cells/uL (ref 1500–7800)
Neutrophils Relative %: 65.6 %
Platelets: 508 10*3/uL — ABNORMAL HIGH (ref 140–400)
RBC: 4.32 10*6/uL (ref 3.80–5.10)
RDW: 12.3 % (ref 11.0–15.0)
Total Lymphocyte: 22 %
WBC: 8.6 10*3/uL (ref 3.8–10.8)

## 2019-08-23 LAB — LUPUS(12) PANEL
Anti Nuclear Antibody (ANA): NEGATIVE
C3 Complement: 105 mg/dL (ref 83–193)
C4 Complement: 20 mg/dL (ref 15–57)
ENA SM Ab Ser-aCnc: <1 AI
Rheumatoid fact SerPl-aCnc: <14 [IU]/mL (ref <14)
Ribosomal P Protein Ab: <1 AI
SM/RNP: <1 AI
SSA (Ro) (ENA) Antibody, IgG: <1 AI
SSB (La) (ENA) Antibody, IgG: <1 AI
Scleroderma (Scl-70) (ENA) Antibody, IgG: <1 AI
Thyroperoxidase Ab SerPl-aCnc: <1 IU/mL (ref <9)
ds DNA Ab: 1 IU/mL

## 2019-08-23 LAB — COMPREHENSIVE METABOLIC PANEL
AG Ratio: 1.8 (calc) (ref 1.0–2.5)
ALT: 19 U/L (ref 6–29)
AST: 21 U/L (ref 10–35)
Albumin: 4.2 g/dL (ref 3.6–5.1)
Alkaline phosphatase (APISO): 91 U/L (ref 37–153)
BUN: 10 mg/dL (ref 7–25)
CO2: 28 mmol/L (ref 20–32)
Calcium: 9.2 mg/dL (ref 8.6–10.4)
Chloride: 98 mmol/L (ref 98–110)
Creat: 0.62 mg/dL (ref 0.50–0.99)
Globulin: 2.4 g/dL (calc) (ref 1.9–3.7)
Glucose, Bld: 94 mg/dL (ref 65–99)
Potassium: 4.2 mmol/L (ref 3.5–5.3)
Sodium: 134 mmol/L — ABNORMAL LOW (ref 135–146)
Total Bilirubin: 0.4 mg/dL (ref 0.2–1.2)
Total Protein: 6.6 g/dL (ref 6.1–8.1)

## 2019-08-23 LAB — RHEUMATOID FACTOR (IGA, IGG, IGM)
Rheumatoid Factor (IgA): <5 U (ref <=6)
Rheumatoid Factor (IgG): <5 U (ref <=6)
Rheumatoid Factor (IgM): <5 U (ref <=6)

## 2019-08-23 LAB — CYCLIC CITRUL PEPTIDE ANTIBODY, IGG: Cyclic Citrullin Peptide Ab: <16 UNITS

## 2019-08-23 LAB — CK: Total CK: 53 U/L (ref 29–143)

## 2019-08-23 LAB — URIC ACID: Uric Acid, Serum: 5 mg/dL (ref 2.5–7.0)

## 2019-08-23 LAB — SEDIMENTATION RATE: Sed Rate: 6 mm/h (ref 0–30)

## 2019-09-04 ENCOUNTER — Telehealth: Payer: BC Managed Care – PPO | Admitting: Osteopathic Medicine

## 2019-09-17 ENCOUNTER — Telehealth (INDEPENDENT_AMBULATORY_CARE_PROVIDER_SITE_OTHER): Payer: BC Managed Care – PPO | Admitting: Sports Medicine

## 2019-09-17 ENCOUNTER — Encounter: Payer: Self-pay | Admitting: Osteopathic Medicine

## 2019-09-17 ENCOUNTER — Encounter: Payer: Self-pay | Admitting: Sports Medicine

## 2019-09-17 ENCOUNTER — Telehealth (INDEPENDENT_AMBULATORY_CARE_PROVIDER_SITE_OTHER): Payer: BC Managed Care – PPO | Admitting: Osteopathic Medicine

## 2019-09-17 VITALS — Temp 97.7°F | Wt 158.0 lb

## 2019-09-17 DIAGNOSIS — M19042 Primary osteoarthritis, left hand: Secondary | ICD-10-CM

## 2019-09-17 DIAGNOSIS — F988 Other specified behavioral and emotional disorders with onset usually occurring in childhood and adolescence: Secondary | ICD-10-CM

## 2019-09-17 DIAGNOSIS — M19041 Primary osteoarthritis, right hand: Secondary | ICD-10-CM | POA: Diagnosis not present

## 2019-09-17 MED ORDER — LISDEXAMFETAMINE DIMESYLATE 70 MG PO CAPS: 70.0000 mg | ORAL_CAPSULE | ORAL | 0 refills | Status: DC

## 2019-09-17 NOTE — Progress Notes (Signed)
She took your advise and took the medicine the way you stated would work better for her. It worked well.

## 2019-09-17 NOTE — Assessment & Plan Note (Signed)
Drea returns, she does have polyarthralgia, prominently in the small joints of the fingers. We did a full rheumatoid and lupus work-up which was negative, she increase Celebrex to 2 pills in the morning and this has helped significantly, she does plan to add a tramadol at night so she does not wake up so stiff, I have also advised that she can do a Celebrex at night and 1 in the morning, she will play around with the dosing, return to see me as needed.

## 2019-09-17 NOTE — Progress Notes (Signed)
Virtual Visit via Video (App used: MyChart) Note  I connected with      Gloria Rivera on 09/17/19 at 10:55 AM  by a telemedicine application and verified that I am speaking with the correct person using two identifiers.  Patient is at home I am in office   I discussed the limitations of evaluation and management by telemedicine and the availability of in person appointments. The patient expressed understanding and agreed to proceed.  History of Present Illness: Gloria Rivera is a 58 y.o. female who would like to discuss adhd follow-up   Last visit 08/07/2019 little over a month ago   ADHD - 10, then 20, then 30 mg Adderall XR not effective. Never been on alternative. We started Vyvanse 60 mg   Today   Vyvanse 60 mg filled 08/10/2019, no side effects and it's helping maybe "25%" in her estimation --> increase to 70 mg and will check in a month!        Observations/Objective: Temp 97.7 F (36.5 C)   Wt 158 lb (71.7 kg)   BMI 25.50 kg/m  BP Readings from Last 3 Encounters:  08/07/19 126/79  03/19/19 122/70  02/27/18 120/74   Exam: Normal Speech.  NAD  Lab and Radiology Results No results found for this or any previous visit (from the past 72 hour(s)). No results found.     Assessment and Plan: 59 y.o. female with The encounter diagnosis was Attention deficit disorder (ADD) in adult.   PDMP not reviewed this encounter. No orders of the defined types were placed in this encounter.  Meds ordered this encounter  Medications  . lisdexamfetamine (VYVANSE) 70 MG capsule    Sig: Take 1 capsule (70 mg total) by mouth every morning.    Dispense:  30 capsule    Refill:  0    If PA required - has tried/failed Adderall 10, 20, 30 mg. Vyvanse 60 was not helpful     Follow Up Instructions: Return in about 4 weeks (around 10/15/2019) for recheck on ADD medications (virtual visit is ok) .    I discussed the assessment and treatment plan with the patient.  The patient was provided an opportunity to ask questions and all were answered. The patient agreed with the plan and demonstrated an understanding of the instructions.   The patient was advised to call back or seek an in-person evaluation if any new concerns, if symptoms worsen or if the condition fails to improve as anticipated.  15 minutes of non-face-to-face time was provided during this encounter.      . . . . . . . . . . . . . Marland Kitchen                   Historical information moved to improve visibility of documentation.  Past Medical History:  Diagnosis Date  . Adult acne   . Anxiety   . Fracture of right ankle   . Stomach ulcer    Past Surgical History:  Procedure Laterality Date  . ABLATION  11/2006  . ANKLE FRACTURE SURGERY Right 12/2006  . BREAST LUMPECTOMY  1997   Benign  . JOINT REPLACEMENT    . KNEE ARTHROSCOPY Left 1995  . Osteoma Removal  12/2012  . TONSILLECTOMY     Social History   Tobacco Use  . Smoking status: Never Smoker  . Smokeless tobacco: Never Used  Substance Use Topics  . Alcohol use: Yes    Alcohol/week: 2.0  standard drinks    Types: 2 Glasses of wine per week   family history includes Breast cancer in her maternal grandmother; COPD in her father; Heart attack in her maternal uncle.  Medications: Current Outpatient Medications  Medication Sig Dispense Refill  . ampicillin (PRINCIPEN) 500 MG capsule Take 500 mg by mouth.    . diazepam (VALIUM) 10 MG tablet Take 0.5-1 tablets (5-10 mg total) by mouth every 12 (twelve) hours as needed for anxiety (prior to COVID vaccine). 1 tablet 1  . Estradiol-Norethindrone Acet 0.5-0.1 MG tablet Take 1 tablet by mouth daily. 84 tablet 4  . lisdexamfetamine (VYVANSE) 70 MG capsule Take 1 capsule (70 mg total) by mouth every morning. 30 capsule 0  . multivitamin-lutein (OCUVITE-LUTEIN) CAPS capsule Take 1 capsule by mouth daily.    . traMADol (ULTRAM) 50 MG tablet Take 1-2 tablets  (50-100 mg total) by mouth every 8 (eight) hours as needed for moderate pain. Maximum 6 tabs per day. 21 tablet 0  . venlafaxine XR (EFFEXOR-XR) 150 MG 24 hr capsule Take 1 capsule (150 mg total) by mouth daily with breakfast. 90 capsule 4   No current facility-administered medications for this visit.   Allergies  Allergen Reactions  . Bee Venom

## 2019-09-17 NOTE — Progress Notes (Signed)
   Virtual Visit via WebEx/MyChart   I connected with  Gloria Rivera  on 09/17/19 via WebEx/MyChart/Doximity Video and verified that I am speaking with the correct person using two identifiers.   I discussed the limitations, risks, security and privacy concerns of performing an evaluation and management service by WebEx/MyChart/Doximity Video, including the higher likelihood of inaccurate diagnosis and treatment, and the availability of in person appointments.  We also discussed the likely need of an additional face to face encounter for complete and high quality delivery of care.  I also discussed with the patient that there may be a patient responsible charge related to this service. The patient expressed understanding and wishes to proceed.  Provider location is either at home or medical facility. Patient location is at their home, different from provider location. People involved in care of the patient during this telehealth encounter were myself, my nurse/medical assistant, and my front office/scheduling team member.  Review of Systems: No fevers, chills, night sweats, weight loss, chest pain, or shortness of breath.   Objective Findings:    General: Speaking full sentences, no audible heavy breathing.  Sounds alert and appropriately interactive.  Appears well.  Face symmetric.  Extraocular movements intact.  Pupils equal and round.  No nasal flaring or accessory muscle use visualized.  Independent interpretation of tests performed by another provider:   None.  Brief History, Exam, Impression, and Recommendations:    Primary osteoarthritis of both hands Gloria Rivera returns, she does have polyarthralgia, prominently in the small joints of the fingers. We did a full rheumatoid and lupus work-up which was negative, she increase Celebrex to 2 pills in the morning and this has helped significantly, she does plan to add a tramadol at night so she does not wake up so stiff, I have also advised  that she can do a Celebrex at night and 1 in the morning, she will play around with the dosing, return to see me as needed.   I discussed the above assessment and treatment plan with the patient. The patient was provided an opportunity to ask questions and all were answered. The patient agreed with the plan and demonstrated an understanding of the instructions.   The patient was advised to call back or seek an in-person evaluation if the symptoms worsen or if the condition fails to improve as anticipated.   I provided 30 minutes of face to face and non-face-to-face time during this encounter date, time was needed to gather information, review chart, records, communicate/coordinate with staff remotely, as well as complete documentation.   ___________________________________________ Ihor Austin. Benjamin Stain, M.D., ABFM., CAQSM. Primary Care and Sports Medicine [facility]  Adjunct Instructor of Family Medicine  [employer]

## 2019-09-18 ENCOUNTER — Telehealth: Payer: Self-pay | Admitting: Osteopathic Medicine

## 2019-09-18 NOTE — Telephone Encounter (Signed)
Received fax for PA on Vyvanse sent through cover my meds waiting on determination. - CF 

## 2019-09-19 NOTE — Telephone Encounter (Signed)
Received fax from [facility] and they approved coverage on Vyvanse valid from 09/18/2019 - 09/18/2022. - CF

## 2019-10-12 ENCOUNTER — Other Ambulatory Visit: Payer: Self-pay | Admitting: Sports Medicine

## 2019-10-12 DIAGNOSIS — M19041 Primary osteoarthritis, right hand: Secondary | ICD-10-CM

## 2019-10-12 DIAGNOSIS — M19042 Primary osteoarthritis, left hand: Secondary | ICD-10-CM

## 2019-10-18 ENCOUNTER — Encounter: Payer: Self-pay | Admitting: Osteopathic Medicine

## 2019-10-29 ENCOUNTER — Encounter: Payer: Self-pay | Admitting: Obstetrics & Gynecology

## 2019-10-29 ENCOUNTER — Telehealth: Payer: Self-pay | Admitting: Obstetrics & Gynecology

## 2019-10-29 NOTE — Progress Notes (Signed)
GYNECOLOGY  VISIT  CC:   Vulvar irritation  HPI: 59 y.o. G22P3003 [name] here for vaginal itching and started two weeks ago.  She reports this is the "exact same thing she had in December".  She was diagnosed with BV at that time.  She had similar symptoms again in March when she was in [place] visiting her daughter.  She was seen by a provider as she was in [place] for a month.  She was diagnosed with BV again.  She was treated with metronidazole.  Symptoms did resolve. Pt did have yeast after the treatment so ended up being treated for that as well.  Denies urinary symptoms, vaginal dryness, vaginal bleeding.  She reports minimal discharge.     Pt has a lot of questions about why this has happened three times this year, questions about other treatments or possible ways to "fix" this.  GYNECOLOGIC HISTORY: No LMP recorded. Patient is postmenopausal. Contraception: post menopausal Menopausal hormone therapy: estradiol-norethindrone acet 0.5-0.1mg   Patient Active Problem List   Diagnosis Date Noted  . Primary osteoarthritis of both hands 07/20/2019  . Hip pain 09/18/2014  . Muscle pain 09/18/2014  . Menopausal symptom 09/18/2014    Past Medical History:  Diagnosis Date  . Adult acne   . Anxiety   . Fracture of right ankle   . Stomach ulcer     Past Surgical History:  Procedure Laterality Date  . ABLATION  11/2006  . ANKLE FRACTURE SURGERY Right 12/2006  . BREAST LUMPECTOMY  1997   Benign  . JOINT REPLACEMENT    . KNEE ARTHROSCOPY Left 1995  . Osteoma Removal  12/2012  . TONSILLECTOMY      MEDS:   Current Outpatient Medications on File Prior to Visit  Medication Sig Dispense Refill  . ampicillin (PRINCIPEN) 500 MG capsule Take 500 mg by mouth.    . celecoxib (CELEBREX) 200 MG capsule TAKE 1 TO 2 CAPSULES EVERY DAY AS NEEDED FOR PAIN 60 capsule 2  . Estradiol-Norethindrone Acet 0.5-0.1 MG tablet Take 1 tablet by mouth daily. 84 tablet 4  .  venlafaxine XR (EFFEXOR-XR) 150 MG 24 hr capsule Take 1 capsule (150 mg total) by mouth daily with breakfast. 90 capsule 4   No current facility-administered medications on file prior to visit.    ALLERGIES: Bee venom  Family History  Problem Relation Age of Onset  . Heart attack Maternal Uncle   . Breast cancer Maternal Grandmother   . COPD Father     SH:  Married, non smoker  Review of Systems  Constitutional: Negative.   HENT: Negative.   Eyes: Negative.   Respiratory: Negative.   Cardiovascular: Negative.   Gastrointestinal: Negative.   Endocrine: Negative.   Genitourinary:       Vaginal itching  Musculoskeletal: Negative.   Skin: Negative.   Allergic/Immunologic: Negative.   Neurological: Negative.   Hematological: Negative.   Psychiatric/Behavioral: Negative.     PHYSICAL EXAMINATION:    BP 110/70   Pulse 68   Resp 16   Wt 160 lb (72.6 kg)   BMI 25.82 kg/m     General appearance: alert, cooperative and appears stated age Lymph:  no inguinal LAD noted  Pelvic: External genitalia:  no lesions, whitish discharge on internal labia majora              Urethra:  normal appearing urethra with no masses, tenderness or lesions  Bartholins and Skenes: normal                 Vagina: atrophic vaginal and periurethral tissue with vaginal discharge noted              Cervix: no lesions              Bimanual Exam:  Uterus:  normal size, contour, position, consistency, mobility, non-tender              Adnexa: no mass, fullness, tenderness            Chaperone, Zenovia Jordan, CMA, was present for exam.  Assessment: Vaginal/inner labia majora discharge H/o BV this year x 2 Atrophic changes  Plan: Affirm obtained today. Metrogel one applicator full nightly x 5 nights.  Also gave diflucan 150mg  po x 1, repeat in 72 hours if needed.  #2/0RF. Due to atrophy, will start estrace vaginal cream 1 gram pv twice weekly for at least 2 months.  Plan recheck in  2-3 months.

## 2019-10-29 NOTE — Telephone Encounter (Signed)
[name], [name] [name]. It seems I have another case of BV. It's exactly like the one last December. Not awful, just annoying. Recommendation? Thanks so much.

## 2019-10-29 NOTE — Telephone Encounter (Signed)
Spoke with patient. Patient reports external vaginal itching. Symptoms started 2 wks ago. Denies any other symptoms. Patient is requesting RX. Advised OV needed for further evaluation, patient agreeable. OV scheduled for 8/3 at 10am with Dr. Hyacinth Meeker.   Last AEX 03/19/19  Routing to provider for final review. Patient is agreeable to disposition. Will close encounter.

## 2019-10-30 ENCOUNTER — Other Ambulatory Visit: Payer: Self-pay

## 2019-10-30 ENCOUNTER — Encounter: Payer: Self-pay | Admitting: Obstetrics & Gynecology

## 2019-10-30 ENCOUNTER — Ambulatory Visit: Payer: BC Managed Care – PPO | Admitting: Obstetrics & Gynecology

## 2019-10-30 VITALS — BP 110/70 | HR 68 | Resp 16 | Wt 160.0 lb

## 2019-10-30 DIAGNOSIS — N898 Other specified noninflammatory disorders of vagina: Secondary | ICD-10-CM

## 2019-10-30 MED ORDER — ESTRADIOL 0.1 MG/GM VA CREA: TOPICAL_CREAM | VAGINAL | 1 refills | Status: DC

## 2019-10-30 MED ORDER — METRONIDAZOLE 0.75 % VA GEL
1.0000 | Freq: Every day | VAGINAL | 0 refills | Status: DC
Start: 2019-10-30 — End: 2019-11-15

## 2019-10-30 MED ORDER — FLUCONAZOLE 150 MG PO TABS: ORAL_TABLET | ORAL | 0 refills | Status: DC

## 2019-10-31 LAB — VAGINITIS/VAGINOSIS, DNA PROBE
Candida Species: NEGATIVE
Gardnerella vaginalis: POSITIVE — AB
Trichomonas vaginosis: NEGATIVE

## 2019-11-05 ENCOUNTER — Encounter: Payer: Self-pay | Admitting: Obstetrics & Gynecology

## 2019-11-05 ENCOUNTER — Other Ambulatory Visit: Payer: Self-pay | Admitting: Obstetrics & Gynecology

## 2019-11-05 ENCOUNTER — Telehealth: Payer: Self-pay | Admitting: Obstetrics & Gynecology

## 2019-11-05 DIAGNOSIS — Z1231 Encounter for screening mammogram for malignant neoplasm of breast: Secondary | ICD-10-CM

## 2019-11-05 NOTE — Telephone Encounter (Signed)
Spoke with pt. Pt states now having external vaginal itching after taking 5 day course of Metrogel Rx. Pt states didn't know if ok to use Diflucan Rx now?  Pt advised to take 1 Diflucan today and see if sx resolve, then to repeat in 72 hours if not completley resolved. If pt still having questions, concerns or unresolved sx, then call back to office for OV appt. Pt agreeable. Pt states will start using estrace cream on Saturday or Sunday when BV treatment complete. Agreeable to plan of care.   Encounter closed.  +BV on 8/3 Plan per reviewed notes: Metrogel one applicator full nightly x 5 nights.  Also gave diflucan 150mg  po x 1, repeat in 72 hours if needed.  #2/0RF.

## 2019-11-05 NOTE — Telephone Encounter (Signed)
Gloria Rivera, Gloria Rivera  Rivera [facility]. I've completed the 5 day Metronidazole Gel treatment for BV, but still have external itching. I'd guess that it's approximately 40% better. Should I continue with the Gel (I have 5 days of treatment remaining) or move on to the Diclofenac , as planned?  Thanks so much.

## 2019-11-07 ENCOUNTER — Ambulatory Visit: Payer: BC Managed Care – PPO | Admitting: Osteopathic Medicine

## 2019-11-07 ENCOUNTER — Encounter: Payer: Self-pay | Admitting: Osteopathic Medicine

## 2019-11-07 ENCOUNTER — Other Ambulatory Visit: Payer: Self-pay

## 2019-11-07 VITALS — BP 113/77 | HR 75 | Wt 160.0 lb

## 2019-11-07 DIAGNOSIS — F988 Other specified behavioral and emotional disorders with onset usually occurring in childhood and adolescence: Secondary | ICD-10-CM

## 2019-11-07 MED ORDER — METHYLPHENIDATE HCL ER (LA) 40 MG PO CP24
40.0000 mg | ORAL_CAPSULE | ORAL | 0 refills | Status: DC
Start: 2019-11-07 — End: 2020-05-13

## 2019-11-07 NOTE — Patient Instructions (Signed)
Will trial Ritalin Long-Acting  Let me know how this goes after a week or two If it's working, ok great! If it's working for [employer] but wearing off, we can add an afternoon Rx If it's not working, we might think about referral back to psychiatry

## 2019-11-07 NOTE — Progress Notes (Signed)
Gloria Rivera is a 59 y.o. female who presents to  [facility]  today, 11/07/19, seeking care for the following: ADHD/ADD in adult    Previous visit 08/07/2019  ? ADHD - 10, then 20, then 30 mg Adderall XR not effective.Neverbeen on alternative. We started Vyvanse 60 mg   Last visit 09/17/2019 ? Vyvanse 60 mg filled 08/10/2019, no side effects and it's helping maybe "25%" in her estimation --> increase to 70 mg and will check in a month!  Today 11/07/19 wants to discuss alternatives        ASSESSMENT & PLAN with other pertinent findings:  The encounter diagnosis was Attention deficit disorder (ADD) in adult.    Patient Instructions  Will trial Ritalin Long-Acting  Let me know how this goes after a week or two If it's working, ok great! If it's working for [employer] but wearing off, we can add an afternoon Rx If it's not working, we might think about referral back to psychiatry         No orders of the defined types were placed in this encounter.   Meds ordered this encounter  Medications  . methylphenidate (RITALIN LA) 40 MG 24 hr capsule    Sig: Take 1 capsule (40 mg total) by mouth every morning.    Dispense:  30 capsule    Refill:  0       Follow-up instructions: Return for depends on response to Rx if doing well ok to go 6 mos before next visit .                                         BP 113/77 (BP Location: Right Arm, Patient Position: Sitting)   Pulse 75   Wt 160 lb (72.6 kg)   SpO2 96%   BMI 25.82 kg/m   Current Meds  Medication Sig  . ampicillin (PRINCIPEN) 500 MG capsule Take 500 mg by mouth.  . celecoxib (CELEBREX) 200 MG capsule TAKE 1 TO 2 CAPSULES EVERY DAY AS NEEDED FOR PAIN  . estradiol (ESTRACE) 0.1 MG/GM vaginal cream 1 gram vaginally twice weekly.  Do not start until completely done with vaginitis treatment.  . Estradiol-Norethindrone  Acet 0.5-0.1 MG tablet Take 1 tablet by mouth daily.  . fluconazole (DIFLUCAN) 150 MG tablet Take 1 tab po x 1, repeat in 3 days if needed.  Take on last day of metrogel treatment.  . venlafaxine XR (EFFEXOR-XR) 150 MG 24 hr capsule Take 1 capsule (150 mg total) by mouth daily with breakfast.    No results found for this or any previous visit (from the past 72 hour(s)).  No results found.     All questions at time of visit were answered - patient instructed to contact office with any additional concerns or updates.  ER/RTC precautions were reviewed with the patient as applicable.   Please note: voice recognition software was used to produce this document, and typos may escape review. Please contact Dr. Lyn Hollingshead for any needed clarifications.

## 2019-11-07 NOTE — Progress Notes (Signed)
GYNECOLOGY  VISIT  CC:   Recheck after having persistent BV  HPI: 59 y.o. G20P3003 [name] here for recheck. Reports she feels great and almost cancelled appt.  Didn't realize how uncomfortable her skin was until it was actually treated.  She has also started on the vaginal estrogen cream and is using twice weekly.  Just used second dosage.  Denies vaginal bleeding or discharge.  GYNECOLOGIC HISTORY: No LMP recorded. Patient is postmenopausal. Contraception: post menopausal  Menopausal hormone therapy: estrace vaginal cream estradiol-norethindrone acet 0.5-0.1mg    Patient Active Problem List   Diagnosis Date Noted  . Primary osteoarthritis of both hands 07/20/2019  . Hip pain 09/18/2014  . Muscle pain 09/18/2014  . Menopausal symptom 09/18/2014    Past Medical History:  Diagnosis Date  . Adult acne   . Anxiety   . Arthritis    both hands  . Fracture of right ankle   . Stomach ulcer     Past Surgical History:  Procedure Laterality Date  . ABLATION  11/2006  . ANKLE FRACTURE SURGERY Right 12/2006  . BREAST LUMPECTOMY  1997   Benign  . JOINT REPLACEMENT    . KNEE ARTHROSCOPY Left 1995  . Osteoma Removal  12/2012  . TONSILLECTOMY      MEDS:   Current Outpatient Medications on File Prior to Visit  Medication Sig Dispense Refill  . ampicillin (PRINCIPEN) 500 MG capsule Take 500 mg by mouth.    . celecoxib (CELEBREX) 200 MG capsule TAKE 1 TO 2 CAPSULES EVERY DAY AS NEEDED FOR PAIN 60 capsule 2  . estradiol (ESTRACE) 0.1 MG/GM vaginal cream 1 gram vaginally twice weekly.  Do not start until completely done with vaginitis treatment. 42.5 g 1  . Estradiol-Norethindrone Acet 0.5-0.1 MG tablet Take 1 tablet by mouth daily. 84 tablet 4  . methylphenidate (RITALIN LA) 40 MG 24 hr capsule Take 1 capsule (40 mg total) by mouth every morning. 30 capsule 0  . venlafaxine XR (EFFEXOR-XR) 150 MG 24 hr capsule Take 1 capsule (150 mg total) by mouth daily with  breakfast. 90 capsule 4   No current facility-administered medications on file prior to visit.    ALLERGIES: Bee venom  Family History  Problem Relation Age of Onset  . Heart attack Maternal Uncle   . Breast cancer Maternal Grandmother   . COPD Father     SH:  Married, non smoker  Review of Systems  Constitutional: Negative.   HENT: Negative.   Eyes: Negative.   Respiratory: Negative.   Cardiovascular: Negative.   Gastrointestinal: Negative.   Endocrine: Negative.   Genitourinary: Negative.   Musculoskeletal: Negative.   Skin: Negative.   Allergic/Immunologic: Negative.   Neurological: Negative.   Hematological: Negative.   Psychiatric/Behavioral: Negative.   All other systems reviewed and are negative.   PHYSICAL EXAMINATION:    BP 104/62   Pulse 68   Resp 16   Wt 160 lb (72.6 kg)   BMI 25.82 kg/m     General appearance: alert, cooperative and appears stated age Lymph:  no inguinal LAD noted  Pelvic: External genitalia:  no lesions              Urethra:  normal appearing urethra with no masses, tenderness or lesions              Bartholins and Skenes: normal                 Vagina: normal appearing vagina  with normal color and discharge, no lesions              Cervix: no lesions              Bimanual Exam:  Uterus:  normal size, contour, position, consistency, mobility, non-tender              Adnexa: no mass, fullness, tenderness              Chaperone, Cornelia Copa, CMA, was present for exam.  Assessment: H/o BV x 2 earlier this summer Vaginal atrophic changes  Plan: BV vaginitis testing obtained today Continue estrogen cream at least twice weekly for a few months and then can decrease to once weekly or once every other week as maintenance.  Does not need Rx.

## 2019-11-08 ENCOUNTER — Ambulatory Visit
Admission: RE | Admit: 2019-11-08 | Discharge: 2019-11-08 | Disposition: A | Discharge status: Home / Self Care | Payer: BC Managed Care – PPO | Source: Ambulatory Visit | Attending: Obstetrics & Gynecology | Admitting: Obstetrics & Gynecology

## 2019-11-08 ENCOUNTER — Telehealth: Payer: Self-pay | Admitting: Osteopathic Medicine

## 2019-11-08 DIAGNOSIS — Z1231 Encounter for screening mammogram for malignant neoplasm of breast: Secondary | ICD-10-CM

## 2019-11-08 NOTE — Telephone Encounter (Signed)
Received fax for PA on Methylphenidate sent through cover my meds waiting on determination. - CF

## 2019-11-09 NOTE — Telephone Encounter (Signed)
Received approval via fax.   Covered 11/08/19 until 11/08/22  Patient notified via MyChart, pharmacy notified via fax.   Auth sent to scan.

## 2019-11-15 ENCOUNTER — Other Ambulatory Visit: Payer: Self-pay

## 2019-11-15 ENCOUNTER — Encounter: Payer: Self-pay | Admitting: Obstetrics & Gynecology

## 2019-11-15 ENCOUNTER — Ambulatory Visit (INDEPENDENT_AMBULATORY_CARE_PROVIDER_SITE_OTHER): Payer: BC Managed Care – PPO | Admitting: Obstetrics & Gynecology

## 2019-11-15 VITALS — BP 104/62 | HR 68 | Resp 16 | Wt 160.0 lb

## 2019-11-15 DIAGNOSIS — N760 Acute vaginitis: Secondary | ICD-10-CM | POA: Diagnosis not present

## 2019-11-19 LAB — BACTERIAL VAGINOSIS, NAA

## 2019-11-29 ENCOUNTER — Ambulatory Visit: Payer: BC Managed Care – PPO | Admitting: Obstetrics & Gynecology

## 2019-11-29 ENCOUNTER — Encounter: Payer: Self-pay | Admitting: Obstetrics & Gynecology

## 2019-11-29 ENCOUNTER — Other Ambulatory Visit: Payer: Self-pay

## 2019-11-29 VITALS — BP 110/62 | HR 68 | Resp 16 | Wt 159.0 lb

## 2019-11-29 DIAGNOSIS — N760 Acute vaginitis: Secondary | ICD-10-CM

## 2019-11-29 NOTE — Progress Notes (Signed)
GYNECOLOGY  VISIT  CC:   Vaginal irritation  HPI: 59 y.o. G46P3003 [name] here for new onset of vaginal irritation with slight discharge symptoms.  Denies pelvic pain, back pain or urinary symptoms.  Denies vaginal bleeding.  Has been diagnosed with BV now 3 times since April.  Thought the last treatment was really successful but symptoms are back.  Denies odor.  No STD concerns.  GYNECOLOGIC HISTORY: No LMP recorded. Patient is postmenopausal. Contraception: post menopausal Menopausal hormone therapy: estrace cream, estradiol tablet  Patient Active Problem List   Diagnosis Date Noted  . Primary osteoarthritis of both hands 07/20/2019  . Hip pain 09/18/2014  . Muscle pain 09/18/2014  . Menopausal symptom 09/18/2014    Past Medical History:  Diagnosis Date  . Adult acne   . Anxiety   . Arthritis    both hands  . Fracture of right ankle   . Stomach ulcer     Past Surgical History:  Procedure Laterality Date  . ABLATION  11/2006  . ANKLE FRACTURE SURGERY Right 12/2006  . BREAST LUMPECTOMY  1997   Benign  . JOINT REPLACEMENT    . KNEE ARTHROSCOPY Left 1995  . Osteoma Removal  12/2012  . TONSILLECTOMY      MEDS:   Current Outpatient Medications on File Prior to Visit  Medication Sig Dispense Refill  . ampicillin (PRINCIPEN) 500 MG capsule Take 500 mg by mouth.    . celecoxib (CELEBREX) 200 MG capsule TAKE 1 TO 2 CAPSULES EVERY DAY AS NEEDED FOR PAIN 60 capsule 2  . estradiol (ESTRACE) 0.1 MG/GM vaginal cream 1 gram vaginally twice weekly.  Do not start until completely done with vaginitis treatment. 42.5 g 1  . Estradiol-Norethindrone Acet 0.5-0.1 MG tablet Take 1 tablet by mouth daily. 84 tablet 4  . methylphenidate (RITALIN LA) 40 MG 24 hr capsule Take 1 capsule (40 mg total) by mouth every morning. 30 capsule 0  . venlafaxine XR (EFFEXOR-XR) 150 MG 24 hr capsule Take 1 capsule (150 mg total) by mouth daily with breakfast. 90 capsule 4   No  current facility-administered medications on file prior to visit.    ALLERGIES: Bee venom  Family History  Problem Relation Age of Onset  . Heart attack Maternal Uncle   . Breast cancer Maternal Grandmother   . COPD Father     SH:  Married, non smoker  Review of Systems  Constitutional: Negative.   HENT: Negative.   Eyes: Negative.   Respiratory: Negative.   Cardiovascular: Negative.   Gastrointestinal: Negative.   Endocrine: Negative.   Genitourinary:       Vaginal itching, irritation  Musculoskeletal: Negative.   Skin: Negative.   Allergic/Immunologic: Negative.   Neurological: Negative.   Hematological: Negative.   Psychiatric/Behavioral: Negative.     PHYSICAL EXAMINATION:    BP 110/62   Pulse 68   Resp 16   Wt 159 lb (72.1 kg)   BMI 25.66 kg/m     General appearance: alert, cooperative and appears stated age Abdomen: soft, non-tender; bowel sounds normal; no masses,  no organomegaly Lymph:  no inguinal LAD noted  Pelvic: External genitalia:  no lesions              Urethra:  normal appearing urethra with no masses, tenderness or lesions              Bartholins and Skenes: normal  Vagina: normal appearing vagina with normal color and watery discharge, no lesions              Cervix: no lesions              Bimanual Exam:  Uterus:  normal size, contour, position, consistency, mobility, non-tender              Adnexa: no mass, fullness, tenderness2  Chaperone, Cornelia Copa, CMA, was present for exam.  Assessment: Vaginal irritation/slight discharge Recurrent BV? H/o atrophic vaginal changes  Plan: Treatment regimen for recurrent BV reviewed with pt today.  She still has some metrogel from prior treatment (she thinks about 5 days).  She will start using this and vaginitis testing is obtained today.  If this is positive for BV, will proceed with treatment for recurrent BV. If negative, will continue vaginal estrogen treatment Condom use  recommended to also help decrease vaginitis symptoms.   22 minutes in total spent with pt

## 2019-12-01 LAB — NUSWAB BV AND CANDIDA, NAA
Candida albicans, NAA: NEGATIVE
Candida glabrata, NAA: NEGATIVE

## 2019-12-04 ENCOUNTER — Encounter: Payer: Self-pay | Admitting: Obstetrics & Gynecology

## 2020-01-16 ENCOUNTER — Other Ambulatory Visit: Payer: Self-pay | Admitting: Sports Medicine

## 2020-01-16 DIAGNOSIS — M19042 Primary osteoarthritis, left hand: Secondary | ICD-10-CM

## 2020-01-16 DIAGNOSIS — M19041 Primary osteoarthritis, right hand: Secondary | ICD-10-CM

## 2020-01-28 ENCOUNTER — Telehealth: Payer: Self-pay

## 2020-01-28 NOTE — Telephone Encounter (Signed)
Patient cancelled recheck appointment because she is feeling better.

## 2020-01-29 ENCOUNTER — Ambulatory Visit: Payer: BC Managed Care – PPO | Admitting: Obstetrics & Gynecology

## 2020-03-30 ENCOUNTER — Other Ambulatory Visit: Payer: Self-pay | Admitting: Obstetrics & Gynecology

## 2020-03-31 NOTE — Telephone Encounter (Signed)
3 month refill has been sent. Please have the patient schedule an annual exam prior to further refills

## 2020-04-26 ENCOUNTER — Other Ambulatory Visit: Payer: Self-pay | Admitting: Sports Medicine

## 2020-04-26 DIAGNOSIS — M19041 Primary osteoarthritis, right hand: Secondary | ICD-10-CM

## 2020-04-26 DIAGNOSIS — M19042 Primary osteoarthritis, left hand: Secondary | ICD-10-CM

## 2020-05-13 ENCOUNTER — Ambulatory Visit (INDEPENDENT_AMBULATORY_CARE_PROVIDER_SITE_OTHER): Payer: BC Managed Care – PPO | Admitting: Sports Medicine

## 2020-05-13 ENCOUNTER — Other Ambulatory Visit: Payer: Self-pay

## 2020-05-13 DIAGNOSIS — M7062 Trochanteric bursitis, left hip: Secondary | ICD-10-CM | POA: Diagnosis not present

## 2020-05-13 DIAGNOSIS — M7521 Bicipital tendinitis, right shoulder: Secondary | ICD-10-CM | POA: Insufficient documentation

## 2020-05-13 MED ORDER — MELOXICAM 15 MG PO TABS: ORAL_TABLET | ORAL | 3 refills | Status: DC

## 2020-05-13 NOTE — Progress Notes (Signed)
    Procedures performed today:    None.  Independent interpretation of notes and tests performed by another provider:   None.  Brief History, Exam, Impression, and Recommendations:    Greater trochanteric bursitis, left Acute pain over the greater trochanter, adding meloxicam, home rehab exercises, return to see me in 6 weeks, injection if no better. She will need triazolam for preprocedural anxiolysis.  Biceps tendinitis, right Pain in the proximal biceps, positive speeds test, negative rotator cuff signs. She has been holding her grandchild a good amount. Stop holding the grandchild, meloxicam as below, rehab exercises given, return to see me in 6 weeks, injection if no better, as before she will likely need triazolam for preprocedural anxiolysis if we do an injection.    ___________________________________________ Ihor Austin. Benjamin Stain, M.D., ABFM., CAQSM. Primary Care and Sports Medicine [facility]  Adjunct Instructor of Family Medicine  [employer]

## 2020-05-13 NOTE — Assessment & Plan Note (Signed)
Acute pain over the greater trochanter, adding meloxicam, home rehab exercises, return to see me in 6 weeks, injection if no better. She will need triazolam for preprocedural anxiolysis.

## 2020-05-13 NOTE — Assessment & Plan Note (Signed)
Pain in the proximal biceps, positive speeds test, negative rotator cuff signs. She has been holding her grandchild a good amount. Stop holding the grandchild, meloxicam as below, rehab exercises given, return to see me in 6 weeks, injection if no better, as before she will likely need triazolam for preprocedural anxiolysis if we do an injection.

## 2020-06-05 ENCOUNTER — Ambulatory Visit: Payer: Federal, State, Local not specified - PPO

## 2020-06-08 ENCOUNTER — Other Ambulatory Visit: Payer: Self-pay | Admitting: Obstetrics and Gynecology

## 2020-06-27 ENCOUNTER — Other Ambulatory Visit: Payer: Self-pay | Admitting: Obstetrics and Gynecology

## 2020-06-30 ENCOUNTER — Other Ambulatory Visit: Payer: Self-pay | Admitting: Obstetrics and Gynecology

## 2020-06-30 ENCOUNTER — Telehealth (HOSPITAL_BASED_OUTPATIENT_CLINIC_OR_DEPARTMENT_OTHER): Payer: Self-pay | Admitting: *Deleted

## 2020-06-30 NOTE — Telephone Encounter (Signed)
Pt left message requesting refill on estradiol. She states that she has 3 left. She has an appt with Dr. Hyacinth Meeker at the end of April.

## 2020-06-30 NOTE — Telephone Encounter (Signed)
Patient is scheduled to see Dr.Miller 07/24/20 for annual exam Will route refill request to DR. Hyacinth Meeker

## 2020-07-01 ENCOUNTER — Other Ambulatory Visit (HOSPITAL_BASED_OUTPATIENT_CLINIC_OR_DEPARTMENT_OTHER): Payer: Self-pay | Admitting: Obstetrics & Gynecology

## 2020-07-01 MED ORDER — ESTRADIOL-NORETHINDRONE ACET 0.5-0.1 MG PO TABS: 1.0000 | ORAL_TABLET | Freq: Every day | ORAL | 0 refills | Status: DC

## 2020-07-01 NOTE — Telephone Encounter (Signed)
Rx completed.  Thanks. 

## 2020-07-24 ENCOUNTER — Encounter (HOSPITAL_BASED_OUTPATIENT_CLINIC_OR_DEPARTMENT_OTHER): Payer: Self-pay | Admitting: Obstetrics & Gynecology

## 2020-07-24 ENCOUNTER — Ambulatory Visit (INDEPENDENT_AMBULATORY_CARE_PROVIDER_SITE_OTHER): Payer: BC Managed Care – PPO | Admitting: Obstetrics & Gynecology

## 2020-07-24 ENCOUNTER — Other Ambulatory Visit (HOSPITAL_BASED_OUTPATIENT_CLINIC_OR_DEPARTMENT_OTHER)
Admission: RE | Admit: 2020-07-24 | Discharge: 2020-07-24 | Disposition: A | Discharge status: Home / Self Care | Payer: BC Managed Care – PPO | Source: Ambulatory Visit | Attending: Obstetrics & Gynecology | Admitting: Obstetrics & Gynecology

## 2020-07-24 ENCOUNTER — Other Ambulatory Visit: Payer: Self-pay

## 2020-07-24 ENCOUNTER — Other Ambulatory Visit (HOSPITAL_COMMUNITY)
Admission: RE | Admit: 2020-07-24 | Discharge: 2020-07-24 | Disposition: A | Discharge status: Home / Self Care | Payer: BC Managed Care – PPO | Source: Ambulatory Visit | Attending: Obstetrics & Gynecology | Admitting: Obstetrics & Gynecology

## 2020-07-24 VITALS — BP 122/81 | HR 73 | Ht 66.0 in | Wt 160.2 lb

## 2020-07-24 DIAGNOSIS — F902 Attention-deficit hyperactivity disorder, combined type: Secondary | ICD-10-CM | POA: Insufficient documentation

## 2020-07-24 DIAGNOSIS — R5383 Other fatigue: Secondary | ICD-10-CM | POA: Insufficient documentation

## 2020-07-24 DIAGNOSIS — Z124 Encounter for screening for malignant neoplasm of cervix: Secondary | ICD-10-CM | POA: Insufficient documentation

## 2020-07-24 DIAGNOSIS — N952 Postmenopausal atrophic vaginitis: Secondary | ICD-10-CM

## 2020-07-24 DIAGNOSIS — Z87440 Personal history of urinary (tract) infections: Secondary | ICD-10-CM | POA: Diagnosis not present

## 2020-07-24 DIAGNOSIS — N760 Acute vaginitis: Secondary | ICD-10-CM | POA: Diagnosis not present

## 2020-07-24 DIAGNOSIS — Z1231 Encounter for screening mammogram for malignant neoplasm of breast: Secondary | ICD-10-CM

## 2020-07-24 DIAGNOSIS — Z01419 Encounter for gynecological examination (general) (routine) without abnormal findings: Secondary | ICD-10-CM

## 2020-07-24 DIAGNOSIS — F419 Anxiety disorder, unspecified: Secondary | ICD-10-CM | POA: Insufficient documentation

## 2020-07-24 DIAGNOSIS — M8580 Other specified disorders of bone density and structure, unspecified site: Secondary | ICD-10-CM

## 2020-07-24 DIAGNOSIS — Z9229 Personal history of other drug therapy: Secondary | ICD-10-CM

## 2020-07-24 LAB — CBC WITH DIFFERENTIAL/PLATELET
Abs Immature Granulocytes: 0.02 10*3/uL (ref 0.00–0.07)
Basophils Absolute: 0.1 10*3/uL (ref 0.0–0.1)
Basophils Relative: 1 %
Eosinophils Absolute: 0.3 10*3/uL (ref 0.0–0.5)
Eosinophils Relative: 5 %
HCT: 40.1 % (ref 36.0–46.0)
Hemoglobin: 13.2 g/dL (ref 12.0–15.0)
Immature Granulocytes: 0 %
Lymphocytes Relative: 23 %
Lymphs Abs: 1.7 10*3/uL (ref 0.7–4.0)
MCH: 29.5 pg (ref 26.0–34.0)
MCHC: 32.9 g/dL (ref 30.0–36.0)
MCV: 89.7 fL (ref 80.0–100.0)
Monocytes Absolute: 0.7 10*3/uL (ref 0.1–1.0)
Monocytes Relative: 10 %
Neutro Abs: 4.4 10*3/uL (ref 1.7–7.7)
Neutrophils Relative %: 61 %
Platelets: 415 10*3/uL — ABNORMAL HIGH (ref 150–400)
RBC: 4.47 MIL/uL (ref 3.87–5.11)
RDW: 13.6 % (ref 11.5–15.5)
WBC: 7.2 10*3/uL (ref 4.0–10.5)
nRBC: 0 % (ref 0.0–0.2)

## 2020-07-24 LAB — VITAMIN D 25 HYDROXY (VIT D DEFICIENCY, FRACTURES): Vit D, 25-Hydroxy: 34.51 ng/mL (ref 30–100)

## 2020-07-24 LAB — CERVICOVAGINAL ANCILLARY ONLY
Bacterial Vaginitis (gardnerella): POSITIVE — AB
Candida Glabrata: NEGATIVE
Candida Vaginitis: NEGATIVE
Comment: NEGATIVE
Comment: NEGATIVE
Comment: NEGATIVE

## 2020-07-24 LAB — TSH: TSH: 0.928 u[IU]/mL (ref 0.350–4.500)

## 2020-07-24 LAB — FERRITIN: Ferritin: 13 ng/mL (ref 11–307)

## 2020-07-24 MED ORDER — ESTRADIOL-NORETHINDRONE ACET 0.5-0.1 MG PO TABS: 1.0000 | ORAL_TABLET | Freq: Every day | ORAL | 4 refills | Status: DC

## 2020-07-24 MED ORDER — ESTRADIOL 0.1 MG/GM VA CREA
TOPICAL_CREAM | VAGINAL | 3 refills | Status: DC
Start: 2020-07-24 — End: 2020-10-09

## 2020-07-24 MED ORDER — VENLAFAXINE HCL ER 150 MG PO CP24: 150.0000 mg | ORAL_CAPSULE | Freq: Every day | ORAL | 4 refills | Status: DC

## 2020-07-24 NOTE — Progress Notes (Signed)
60 y.o. G15P3003 [name] here for annual exam.  Having some recurrent vaginitis symptoms.  She has been doing OTC Monistat.  Had two BV positive tests last year.  Is out of estrace vaginal cream.  Wasn't sure about how frequently she should be using this.  Denies vaginal bleeding.  Did want her urine checked today.  Having fatigue that is bothersome.  Is sleeping well.  Doesn't feel like there is a reason for this.  Has discussed attention issues with Dr. Lyn Rivera.  She has tried several medications without much success.  She's off all of this.  No LMP recorded. Patient is postmenopausal.          Sexually active: Yes.    The current method of family planning is post menopausal status.    Exercising: Yes.   Smoker:  no  Health Maintenance: Pap:  Will update today History of abnormal Pap:  no MMG:  10/2019 Colonoscopy:  04/08/2018 BMD:   Plan in august TDaP:  Due, pt declines Pneumonia vaccine(s):  Not indicated Shingrix:  Completed 2021 Hep C testing: 2018 Screening Labs: Dr. Lyn Rivera   reports that she has never smoked. She has never used smokeless tobacco. She reports current alcohol use of about 2.0 standard drinks of alcohol per week. She reports that she does not use drugs.  Past Medical History:  Diagnosis Date  . Adult acne   . Anxiety   . Arthritis    both hands  . Fracture of right ankle   . Stomach ulcer     Past Surgical History:  Procedure Laterality Date  . ABLATION  11/2006  . ANKLE FRACTURE SURGERY Right 12/2006  . BREAST LUMPECTOMY  1997   Benign  . JOINT REPLACEMENT    . KNEE ARTHROSCOPY Left 1995  . Osteoma Removal  12/2012  . TONSILLECTOMY      Current Outpatient Medications  Medication Sig Dispense Refill  . ampicillin (PRINCIPEN) 500 MG capsule Take 500 mg by mouth.    . estradiol (ESTRACE) 0.1 MG/GM vaginal cream 1 gram vaginally twice weekly.  Do not start until completely done with vaginitis treatment. 42.5 g 1  .  Estradiol-Norethindrone Acet 0.5-0.1 MG tablet Take 1 tablet by mouth daily. 84 tablet 0  . meloxicam (MOBIC) 15 MG tablet One tab PO qAM with a meal for 2 weeks, then daily prn pain. 30 tablet 3  . Multiple Vitamin (MULTIVITAMIN) capsule Take 1 capsule by mouth daily.    Marland Kitchen venlafaxine XR (EFFEXOR-XR) 150 MG 24 hr capsule TAKE 1 CAPSULE (150 MG TOTAL) BY MOUTH DAILY WITH BREAKFAST. 90 capsule 0   No current facility-administered medications for this visit.    Family History  Problem Relation Age of Onset  . Heart attack Maternal Uncle   . Breast cancer Maternal Grandmother   . COPD Father     Review of Systems  All other systems reviewed and are negative.   Exam:   BP 122/81 (BP Location: Right Arm, Patient Position: Sitting, Cuff Size: Small)   Pulse 73   Ht 5\' 6"  (1.676 m)   Wt 160 lb 3.2 oz (72.7 kg)   BMI 25.86 kg/m   Height: 5\' 6"  (167.6 cm)  General appearance: alert, cooperative and appears stated age Head: Normocephalic, without obvious abnormality, atraumatic Neck: no adenopathy, supple, symmetrical, trachea midline and thyroid normal to inspection and palpation Lungs: clear to auscultation bilaterally Breasts: normal appearance, no masses or tenderness Heart: regular rate and rhythm Abdomen:  soft, non-tender; bowel sounds normal; no masses,  no organomegaly Extremities: extremities normal, atraumatic, no cyanosis or edema Skin: Skin color, texture, turgor normal. No rashes or lesions Lymph nodes: Cervical, supraclavicular, and axillary nodes normal. No abnormal inguinal nodes palpated Neurologic: Grossly normal   Pelvic: External genitalia:  no lesions              Urethra:  normal appearing urethra with no masses, tenderness or lesions              Bartholins and Skenes: normal                 Vagina: normal appearing vagina with normal color and no discharge, no lesions              Cervix: no lesions              Pap taken: Yes.   Bimanual Exam:  Uterus:   normal size, contour, position, consistency, mobility, non-tender              Adnexa: normal adnexa and no mass, fullness, tenderness               Rectovaginal: Confirms               Anus:  normal sphincter tone, no lesions  Chaperone, Gloria Rivera, CMA, was present for exam.  Assessment/Plan: 1. Well woman exam with routine gynecological exam - pap and HR HPV obtained today - MMG 10/2019 - BMD is scheduled for August - colonoscopy 04/08/2018 - lab work done with Dr. Lyn Rivera - vaccines updated  2. History of UTI - POCT Urinalysis Dipstick (normal)  3. Recurrent vaginitis - Cervicovaginal ancillary only( Gloria Rivera)  4. Vaginal atrophy - estradiol (ESTRACE) 0.1 MG/GM vaginal cream; 1 gram vaginally twice weekly.  Dispense: 42.5 g; Refill: 3  5. Fatigue, unspecified type - CBC with Differential/Platelet; Future - TSH; Future - VITAMIN D 25 Hydroxy (Vit-D Deficiency, Fractures); Future - Ferritin; Future  6. Osteopenia, unspecified location - DG BONE DENSITY (DXA); Future  7. History of postmenopausal HRT - Estradiol-Norethindrone Acet 0.5-0.1 MG tablet; Take 1 tablet by mouth daily.  Dispense: 84 tablet; Refill: 4  8. Anxiety - venlafaxine XR (EFFEXOR-XR) 150 MG 24 hr capsule; Take 1 capsule (150 mg total) by mouth daily with breakfast.  Dispense: 90 capsule; Refill: 4

## 2020-07-25 ENCOUNTER — Encounter (HOSPITAL_BASED_OUTPATIENT_CLINIC_OR_DEPARTMENT_OTHER): Payer: Self-pay

## 2020-07-25 LAB — POCT URINALYSIS DIPSTICK
Appearance: NORMAL
Bilirubin, UA: NEGATIVE
Glucose, UA: NEGATIVE
Ketones, UA: NEGATIVE
Leukocytes, UA: NEGATIVE
Nitrite, UA: NEGATIVE
Protein, UA: NEGATIVE
Urobilinogen, UA: 0.2 E.U./dL

## 2020-07-25 MED ORDER — METRONIDAZOLE 500 MG PO TABS: 500.0000 mg | ORAL_TABLET | Freq: Two times a day (BID) | ORAL | 0 refills | Status: DC

## 2020-07-28 LAB — CYTOLOGY - PAP
Adequacy: ABSENT
Comment: NEGATIVE
Diagnosis: NEGATIVE
High risk HPV: NEGATIVE

## 2020-07-28 NOTE — Telephone Encounter (Signed)
Called and spoke with patient about scheduling appointment. Patient will be coming in on 08/27/2020 @1 :00. tbw

## 2020-07-28 NOTE — Telephone Encounter (Signed)
-----   Message from Mary S Miller, MD sent at 07/25/2020  2:54 PM EDT ----- Gloria Rivera, I've called this pt personally.  She does needs a repeat vaginitis testing in about 4-5 weeks.  Please call her to schedule.  Thank you.   Dr. Miller 

## 2020-07-28 NOTE — Telephone Encounter (Signed)
-----   Message from [name], MD sent at 07/25/2020  2:54 PM EDT ----- Gloria Rivera called this pt personally.  She does needs a repeat vaginitis testing in about 4-5 weeks.  Please call her to schedule.  Thank you.   Dr. Hyacinth Meeker

## 2020-08-27 ENCOUNTER — Other Ambulatory Visit: Payer: Self-pay

## 2020-08-27 ENCOUNTER — Other Ambulatory Visit (HOSPITAL_COMMUNITY)
Admission: RE | Admit: 2020-08-27 | Discharge: 2020-08-27 | Disposition: A | Discharge status: Home / Self Care | Payer: BC Managed Care – PPO | Source: Ambulatory Visit | Attending: Obstetrics & Gynecology | Admitting: Obstetrics & Gynecology

## 2020-08-27 ENCOUNTER — Ambulatory Visit (INDEPENDENT_AMBULATORY_CARE_PROVIDER_SITE_OTHER): Payer: BC Managed Care – PPO | Admitting: Obstetrics & Gynecology

## 2020-08-27 VITALS — BP 131/88 | HR 72 | Ht 65.5 in | Wt 158.0 lb

## 2020-08-27 DIAGNOSIS — I499 Cardiac arrhythmia, unspecified: Secondary | ICD-10-CM | POA: Diagnosis not present

## 2020-08-27 DIAGNOSIS — N760 Acute vaginitis: Secondary | ICD-10-CM

## 2020-08-27 NOTE — Patient Instructions (Addendum)
Try taking the MVI with iron about two times weekly. If that doesn't help, try ferrous gluconate.  Yours probably contains ferrous sulfate (FeSO4). You might consider taking colace with the vitamin to help with constipation as well.

## 2020-08-27 NOTE — Progress Notes (Signed)
GYNECOLOGY  VISIT  CC:   Recheck after BV diagnosis  HPI: 61 y.o. G33P3003 [name] here for recheck after taking flagyl x 7 days and then using boric acid for one week.  Pt reports she is feeling much better.  Denies itching or discharge.  Planned to repeat testing today and if positive, will proceed with twice weekly metrogel treatment for 3-6 months.  Would repeat testing at 3 month interval.  Discussed all of this with pt.  However, if testing is negative, she desires to stop medication at this time.  Separately, pt complains of noticing what feels like palpitations or irregular heart beat at times.  This is been occasional in the past but is now much more common and worrisome to her.  Daughter is a health care provider but she decided not to mention to her.  Feel pt likely needs monitoring so will refer to cardiology.  GYNECOLOGIC HISTORY: No LMP recorded. Patient is postmenopausal. Contraception: PMP Menopausal hormone therapy: none  Patient Active Problem List   Diagnosis Date Noted  . Attention deficit hyperactivity disorder, combined type 07/24/2020  . Anxiety   . Biceps tendinitis, right 05/13/2020  . Greater trochanteric bursitis, left 05/13/2020  . Primary osteoarthritis of both hands 07/20/2019  . Hip pain 09/18/2014  . Muscle pain 09/18/2014  . Menopausal symptom 09/18/2014    Past Medical History:  Diagnosis Date  . Adult acne   . Anxiety   . Arthritis    both hands  . Fracture of right ankle   . Stomach ulcer     Past Surgical History:  Procedure Laterality Date  . ABLATION  11/2006  . ANKLE FRACTURE SURGERY Right 12/2006  . BREAST LUMPECTOMY  1997   Benign  . JOINT REPLACEMENT    . KNEE ARTHROSCOPY Left 1995  . Osteoma Removal  12/2012  . TONSILLECTOMY      MEDS:   Current Outpatient Medications on File Prior to Visit  Medication Sig Dispense Refill  . ampicillin (PRINCIPEN) 500 MG capsule Take 500 mg by mouth.    .  celecoxib (CELEBREX) 200 MG capsule Take 200 mg by mouth 2 (two) times daily.    Marland Kitchen estradiol (ESTRACE) 0.1 MG/GM vaginal cream 1 gram vaginally twice weekly. 42.5 g 3  . Estradiol-Norethindrone Acet 0.5-0.1 MG tablet Take 1 tablet by mouth daily. 84 tablet 4  . Multiple Vitamin (MULTIVITAMIN) capsule Take 1 capsule by mouth daily.    Marland Kitchen venlafaxine XR (EFFEXOR-XR) 150 MG 24 hr capsule Take 1 capsule (150 mg total) by mouth daily with breakfast. 90 capsule 4  . meloxicam (MOBIC) 15 MG tablet One tab PO qAM with a meal for 2 weeks, then daily prn pain. (Patient not taking: Reported on 08/27/2020) 30 tablet 3  . metroNIDAZOLE (FLAGYL) 500 MG tablet Take 1 tablet (500 mg total) by mouth 2 (two) times daily. (Patient not taking: Reported on 08/27/2020) 14 tablet 0   No current facility-administered medications on file prior to visit.    ALLERGIES: Bee venom  Family History  Problem Relation Age of Onset  . Heart attack Maternal Uncle   . Breast cancer Maternal Grandmother   . COPD Father     SH:  Married, non smoker  Review of Systems  Constitutional: Negative.   Genitourinary: Negative.     PHYSICAL EXAMINATION:    BP 131/88   Pulse 72   Ht 5' 5.5" (1.664 m)   Wt 158 lb (71.7 kg)  BMI 25.89 kg/m     General appearance: alert, cooperative and appears stated age Lymph:  no inguinal LAD noted  Pelvic: External genitalia:  no lesions              Urethra:  normal appearing urethra with no masses, tenderness or lesions              Bartholins and Skenes: normal                 Vagina: normal appearing vagina with normal color and discharge, no lesions               Chaperone, Ina Homes, CMA, was present for exam.  Assessment/Plan: 1. Recurrent vaginitis - Cervicovaginal ancillary only( ) - if positive, will proceed with metrogel, one applicator nightly two times weekly.  Rx will need to be sent to pharmacy.  2. Irregular heart beat - Ambulatory referral to  Cardiology

## 2020-08-28 ENCOUNTER — Encounter (HOSPITAL_BASED_OUTPATIENT_CLINIC_OR_DEPARTMENT_OTHER): Payer: Self-pay

## 2020-08-28 LAB — CERVICOVAGINAL ANCILLARY ONLY
Bacterial Vaginitis (gardnerella): POSITIVE — AB
Comment: NEGATIVE

## 2020-08-29 ENCOUNTER — Encounter (HOSPITAL_BASED_OUTPATIENT_CLINIC_OR_DEPARTMENT_OTHER): Payer: Self-pay | Admitting: Obstetrics & Gynecology

## 2020-08-29 ENCOUNTER — Other Ambulatory Visit (HOSPITAL_BASED_OUTPATIENT_CLINIC_OR_DEPARTMENT_OTHER): Payer: Self-pay | Admitting: Obstetrics & Gynecology

## 2020-08-29 MED ORDER — METRONIDAZOLE 0.75 % VA GEL: VAGINAL | 6 refills | Status: DC

## 2020-09-08 ENCOUNTER — Encounter: Payer: Self-pay | Admitting: *Deleted

## 2020-09-08 ENCOUNTER — Other Ambulatory Visit: Payer: Self-pay

## 2020-09-08 ENCOUNTER — Ambulatory Visit: Payer: BC Managed Care – PPO | Admitting: Internal Medicine

## 2020-09-08 ENCOUNTER — Encounter: Payer: Self-pay | Admitting: Internal Medicine

## 2020-09-08 VITALS — BP 106/68 | HR 81 | Ht 66.0 in | Wt 160.6 lb

## 2020-09-08 DIAGNOSIS — R002 Palpitations: Secondary | ICD-10-CM

## 2020-09-08 NOTE — Progress Notes (Signed)
Patient ID: Gloria Rivera, female   DOB: Oct 14, 1960, 60 y.o.   MRN: [id number] Patient enrolled for [employer] to ship a 30 day cardiac event monitor to her home.   Instructions sent to patient via My Chart message.

## 2020-09-08 NOTE — Patient Instructions (Signed)
Medication Instructions:  No changes  Lab Work: none  Testing/Procedures: Your physician has recommended that you wear an event monitor. Event monitors are medical devices that record the heart's electrical activity. Doctors most often [place] these monitors to diagnose arrhythmias. Arrhythmias are problems with the speed or rhythm of the heartbeat. The monitor is a small, portable device. You can wear one while you do your normal daily activities. This is usually used to diagnose what is causing palpitations/syncope (passing out).  Follow-Up: Based on monitor results.

## 2020-09-08 NOTE — Progress Notes (Signed)
Cardiology Office Note   Date:  09/08/2020   ID:  [name], DOB 1960/05/26, MRN [id number]  PCP:  Sunnie Nielsen, DO  Cardiologist:   Dietrich Pates, MD    Pt referred for eval of palpitations      History of Present Illness: Gloria Rivera is a 60 y.o. female with a history of palpitations   She wa referred by Dr Hyacinth Meeker    Has had occasionally in past  Now much more common    The patient says she gets occasional CP L sided to back  Not sure what brings it on.   Not terrible.   Not associated with activity   Can occur at rest  Nothing makes better or worse  Last less than a min    The CP satrated about 1 month ago    Pt also complains of palpitations .  Episodes start suddenly   Last seconds   Associated with SOB      Occurring couple times per week   No dizziness   Just alert to it    Has noticed some dizziness  Not lightheated, just more off balance  Esp going down stairs          Current Meds  Medication Sig   celecoxib (CELEBREX) 200 MG capsule Take 200 mg by mouth 2 (two) times daily.   estradiol (ESTRACE) 0.1 MG/GM vaginal cream 1 gram vaginally twice weekly.   Estradiol-Norethindrone Acet 0.5-0.1 MG tablet Take 1 tablet by mouth daily.   metroNIDAZOLE (METROGEL) 0.75 % vaginal gel One applicator full pv twice weekly.   Multiple Vitamin (MULTIVITAMIN) capsule Take 1 capsule by mouth daily.   venlafaxine XR (EFFEXOR-XR) 150 MG 24 hr capsule Take 1 capsule (150 mg total) by mouth daily with breakfast.     Allergies:   Bee venom   Past Medical History:  Diagnosis Date   Adult acne    Anxiety    Arthritis    both hands   Fracture of right ankle    Stomach ulcer     Past Surgical History:  Procedure Laterality Date   ABLATION  11/2006   ANKLE FRACTURE SURGERY Right 12/2006   BREAST LUMPECTOMY  1997   Benign   JOINT REPLACEMENT     KNEE ARTHROSCOPY Left 1995   Osteoma Removal  12/2012   TONSILLECTOMY       Social History:  The patient  reports  that she has never smoked. She has never used smokeless tobacco. She reports current alcohol use of about 2.0 standard drinks of alcohol per week. She reports that she does not use drugs.   Family History:  The patient's family history includes Breast cancer in her maternal grandmother; COPD in her father; Heart attack in her maternal uncle. Uncle smoked     ROS:  Please see the history of present illness. All other systems are reviewed and  Negative to the above problem except as noted.    PHYSICAL EXAM: VS:  BP 106/68   Pulse 81   Ht 5\' 6"  (1.676 m)   Wt 160 lb 9.6 oz (72.8 kg)   SpO2 96%   BMI 25.92 kg/m   GEN: Well nourished, well developed, in no acute distress  HEENT: normal  Neck: no JVD, carotid bruits Cardiac: RRR; no murmurs  No LE edema  Respiratory:  clear to auscultation bilaterally,  GI: soft, nontender, nondistended, + BS  No hepatomegaly  MS: no deformity Moving all extremities  Skin: warm and dry, no rash Neuro:  Strength and sensation are intact Psych: euthymic mood, full affect   EKG:  EKG is ordered today.  SR 69 bpm      Lipid Panel    Component Value Date/Time   CHOL 131 03/19/2019 1207   TRIG 62 03/19/2019 1207   HDL 51 03/19/2019 1207   CHOLHDL 2.6 03/19/2019 1207   LDLCALC 67 03/19/2019 1207      Wt Readings from Last 3 Encounters:  09/08/20 160 lb 9.6 oz (72.8 kg)  08/27/20 158 lb (71.7 kg)  07/24/20 160 lb 3.2 oz (72.7 kg)      ASSESSMENT AND PLAN:  1  Chest pain   Very atypical for ischemia   I think it is noncardiac in origin   WOuld not pursure cardiac testing at this time     2  Palpitations  Episodes are short lived but bothersome   No symtpoms to sugg hemodynamic destabilization   WOuld recomm event monitor to document  3  HCM  LIpdis are excellent  LDL 67  HDL 51  4  Fatgiue   She says she feels tired   But she is very active  I am not convinced due to cardiac problem   Would follow   Discussed diet  Limiting carbs     Follow up based on test results    Current medicines are reviewed at length with the patient today.  The patient does not have concerns regarding medicines.  Signed, Dietrich Pates, MD  09/08/2020 11:00 AM    [facility] [address] [place], [place], [place]  [address] Phone: [phone]; Fax: [phone]

## 2020-09-11 ENCOUNTER — Ambulatory Visit (INDEPENDENT_AMBULATORY_CARE_PROVIDER_SITE_OTHER): Payer: BC Managed Care – PPO

## 2020-09-11 DIAGNOSIS — R002 Palpitations: Secondary | ICD-10-CM

## 2020-09-13 ENCOUNTER — Encounter (HOSPITAL_BASED_OUTPATIENT_CLINIC_OR_DEPARTMENT_OTHER): Payer: Self-pay

## 2020-09-25 ENCOUNTER — Other Ambulatory Visit (HOSPITAL_BASED_OUTPATIENT_CLINIC_OR_DEPARTMENT_OTHER): Payer: Self-pay | Admitting: Obstetrics & Gynecology

## 2020-09-25 ENCOUNTER — Encounter (HOSPITAL_BASED_OUTPATIENT_CLINIC_OR_DEPARTMENT_OTHER): Payer: Self-pay

## 2020-09-25 MED ORDER — CLINDAMYCIN PHOSPHATE 2 % VA CREA: 1.0000 | TOPICAL_CREAM | Freq: Every day | VAGINAL | 0 refills | Status: DC

## 2020-09-30 ENCOUNTER — Other Ambulatory Visit (HOSPITAL_BASED_OUTPATIENT_CLINIC_OR_DEPARTMENT_OTHER): Payer: Self-pay | Admitting: Obstetrics & Gynecology

## 2020-09-30 MED ORDER — FLUCONAZOLE 150 MG PO TABS: 150.0000 mg | ORAL_TABLET | Freq: Once | ORAL | 0 refills | Status: AC

## 2020-10-09 ENCOUNTER — Encounter: Payer: Self-pay | Admitting: Osteopathic Medicine

## 2020-10-09 ENCOUNTER — Other Ambulatory Visit: Payer: Self-pay

## 2020-10-09 ENCOUNTER — Ambulatory Visit: Payer: BC Managed Care – PPO | Admitting: Osteopathic Medicine

## 2020-10-09 VITALS — BP 118/80 | HR 82 | Ht 66.0 in | Wt 155.8 lb

## 2020-10-09 DIAGNOSIS — J329 Chronic sinusitis, unspecified: Secondary | ICD-10-CM | POA: Diagnosis not present

## 2020-10-09 MED ORDER — FLUCONAZOLE 150 MG PO TABS: 150.0000 mg | ORAL_TABLET | Freq: Once | ORAL | 1 refills | Status: AC

## 2020-10-09 MED ORDER — AMOXICILLIN-POT CLAVULANATE 875-125 MG PO TABS: 1.0000 | ORAL_TABLET | Freq: Two times a day (BID) | ORAL | 0 refills | Status: AC

## 2020-10-09 NOTE — Progress Notes (Signed)
Gloria Rivera is a 60 y.o. female who presents to  [facility]  today, 10/09/20, seeking care for the following:  Sinus concerns - post--COVID feeling sinus pressure, throat fullness, dental pain and dizziness.      ASSESSMENT & PLAN with other pertinent findings:  The encounter diagnosis was Sinusitis, unspecified chronicity, unspecified location.    There are no Patient Instructions on file for this visit.  No orders of the defined types were placed in this encounter.   Meds ordered this encounter  Medications   amoxicillin-clavulanate (AUGMENTIN) 875-125 MG tablet    Sig: Take 1 tablet by mouth 2 (two) times daily for 7 days.    Dispense:  14 tablet    Refill:  0   fluconazole (DIFLUCAN) 150 MG tablet    Sig: Take 1 tablet (150 mg total) by mouth once for 1 dose. Repeat dose 72 hours if yeast infection persists    Dispense:  2 tablet    Refill:  1     See below for relevant physical exam findings  See below for recent lab and imaging results reviewed  Medications, allergies, PMH, PSH, SocH, FamH reviewed below    Follow-up instructions: Return in about 6 months (around 04/11/2021) for ANNUAL (call week prior to visit for lab orders), SEE [place] SOONER IF NEEDED. CALL/MESSAGE W/ QUESTIONS.                                        Exam:  BP 118/80   Pulse 82   Ht 5\' 6"  (1.676 m)   Wt 155 lb 12.8 oz (70.7 kg)   SpO2 97%   BMI 25.15 kg/m  Constitutional: VS see above. General Appearance: alert, well-developed, well-nourished, NAD Neck: No masses, trachea midline.  Respiratory: Normal respiratory effort. no wheeze, no rhonchi, no rales Cardiovascular: S1/S2 normal, no murmur, no rub/gallop auscultated. RRR.  Musculoskeletal: Gait normal. Symmetric and independent movement of all extremities Neurological: Normal balance/coordination. No tremor. Skin: warm, dry, intact.   Psychiatric: Normal judgment/insight. Normal mood and affect. Oriented x3.   Current Meds  Medication Sig   amoxicillin-clavulanate (AUGMENTIN) 875-125 MG tablet Take 1 tablet by mouth 2 (two) times daily for 7 days.   Estradiol-Norethindrone Acet 0.5-0.1 MG tablet Take 1 tablet by mouth daily.   [EXPIRED] fluconazole (DIFLUCAN) 150 MG tablet Take 1 tablet (150 mg total) by mouth once for 1 dose. Repeat dose 72 hours if yeast infection persists   Multiple Vitamin (MULTIVITAMIN) capsule Take 1 capsule by mouth daily.   venlafaxine XR (EFFEXOR-XR) 150 MG 24 hr capsule Take 1 capsule (150 mg total) by mouth daily with breakfast.    Allergies  Allergen Reactions   Bee Venom     Patient Active Problem List   Diagnosis Date Noted   Attention deficit hyperactivity disorder, combined type 07/24/2020   Anxiety    Greater trochanteric bursitis, left 05/13/2020   Primary osteoarthritis of both hands 07/20/2019   Menopausal symptom 09/18/2014    Family History  Problem Relation Age of Onset   Heart attack Maternal Uncle    Breast cancer Maternal Grandmother    COPD Father     Social History   Tobacco Use  Smoking Status Never  Smokeless Tobacco Never    Past Surgical History:  Procedure Laterality Date   ABLATION  11/2006   ANKLE  FRACTURE SURGERY Right 12/2006   BREAST LUMPECTOMY  1997   Benign   JOINT REPLACEMENT     KNEE ARTHROSCOPY Left 1995   Osteoma Removal  12/2012   TONSILLECTOMY      Immunization History  Administered Date(s) Administered   Janssen (J&J) SARS-COV-2 Vaccination 09/17/2019   Zoster Recombinat (Shingrix) 04/23/2019, 05/21/2019, 08/07/2019    Recent Results (from the past 2160 hour(s))  Cytology - PAP( [place])     Status: None   Collection Time: 07/24/20 12:00 PM  Result Value Ref Range   High risk HPV Negative    Adequacy      Satisfactory for evaluation; transformation zone component ABSENT.   Diagnosis      - Negative for  intraepithelial lesion or malignancy (NILM)   Comment Normal Reference Range HPV - Negative   Ferritin     Status: None   Collection Time: 07/24/20 12:25 PM  Result Value Ref Range   Ferritin 13 11 - 307 ng/mL    Comment: Performed at [facility], [address] [place], [place] [address]  VITAMIN D 25 Hydroxy (Vit-D Deficiency, Fractures)     Status: None   Collection Time: 07/24/20 12:25 PM  Result Value Ref Range   Vit D, 25-Hydroxy 34.51 30 - 100 ng/mL    Comment: (NOTE) Vitamin D deficiency has been defined by the Institute of Medicine  and an Endocrine Society practice guideline as a level of serum 25-OH  vitamin D less than 20 ng/mL (1,2). The Endocrine Society went on to  further define vitamin D insufficiency as a level between 21 and 29  ng/mL (2).  1. IOM (Institute of Medicine). 2010. Dietary reference intakes for  calcium and D. [place]: [employer]. 2. Holick MF, Binkley [place], Bischoff-Ferrari HA, et al. Evaluation,  treatment, and prevention of vitamin D deficiency: an Endocrine  Society clinical practice guideline, JCEM. 2011 Jul; 96(7): 1911-30.  Performed at [facility], [address], [place], [place] [address]   TSH     Status: None   Collection Time: 07/24/20 12:25 PM  Result Value Ref Range   TSH 0.928 0.350 - 4.500 uIU/mL    Comment: Performed by a 3rd Generation assay with a functional sensitivity of <=0.01 uIU/mL. Performed at [facility], [address], [place], [place] [address]   CBC with Differential/Platelet     Status: Abnormal   Collection Time: 07/24/20 12:25 PM  Result Value Ref Range   WBC 7.2 4.0 - 10.5 K/uL   RBC 4.47 3.87 - 5.11 MIL/uL   Hemoglobin 13.2 12.0 - 15.0 g/dL   HCT 68.6 16.8 - 37.2 %   MCV 89.7 80.0 - 100.0 fL   MCH 29.5 26.0 - 34.0 pg   MCHC 32.9 30.0 - 36.0 g/dL   RDW 90.2 11.1 - 55.2 %   Platelets 415 (H) 150 - 400 K/uL   nRBC 0.0 0.0 - 0.2 %   Neutrophils Relative % 61 %    Neutro Abs 4.4 1.7 - 7.7 K/uL   Lymphocytes Relative 23 %   Lymphs Abs 1.7 0.7 - 4.0 K/uL   Monocytes Relative 10 %   Monocytes Absolute 0.7 0.1 - 1.0 K/uL   Eosinophils Relative 5 %   Eosinophils Absolute 0.3 0.0 - 0.5 K/uL   Basophils Relative 1 %   Basophils Absolute 0.1 0.0 - 0.1 K/uL   Immature Granulocytes 0 %   Abs Immature Granulocytes 0.02  0.00 - 0.07 K/uL    Comment: Performed at [facility], [address], [place], [place] [address]  Cervicovaginal ancillary only( [place])     Status: Abnormal   Collection Time: 07/24/20  2:43 PM  Result Value Ref Range   Bacterial Vaginitis (gardnerella) Positive (A)    Candida Vaginitis Negative    Candida Glabrata Negative    Comment      Normal Reference Range Bacterial Vaginosis - Negative   Comment Normal Reference Range Candida Species - Negative    Comment Normal Reference Range Candida Galbrata - Negative   POCT Urinalysis Dipstick     Status: None   Collection Time: 07/25/20  3:22 PM  Result Value Ref Range   Color, UA yellow    Clarity, UA clear    Glucose, UA Negative Negative   Bilirubin, UA neg    Ketones, UA neg    Spec Grav, UA     Blood, UA trace    pH, UA     Protein, UA Negative Negative   Urobilinogen, UA 0.2 0.2 or 1.0 E.U./dL   Nitrite, UA neg    Leukocytes, UA Negative Negative   Appearance normal    Odor nonef   Cervicovaginal ancillary only( [place])     Status: Abnormal   Collection Time: 08/27/20 10:16 AM  Result Value Ref Range   Bacterial Vaginitis (gardnerella) Positive (A)    Comment      Normal Reference Range Bacterial Vaginosis - Negative    No results found.     All questions at time of visit were answered - patient instructed to contact office with any additional concerns or updates. ER/RTC precautions were reviewed with the patient as applicable.   Please note: manual typing as well as voice recognition software may have been used to produce this  document - typos may escape review. Please contact Dr. Lyn Hollingshead for any needed clarifications.

## 2020-10-10 ENCOUNTER — Ambulatory Visit: Payer: BC Managed Care – PPO | Admitting: Osteopathic Medicine

## 2020-10-27 HISTORY — PX: CATARACT EXTRACTION: SUR2

## 2020-10-31 ENCOUNTER — Telehealth (INDEPENDENT_AMBULATORY_CARE_PROVIDER_SITE_OTHER): Payer: BC Managed Care – PPO | Admitting: Family Medicine

## 2020-10-31 ENCOUNTER — Other Ambulatory Visit: Payer: Self-pay

## 2020-10-31 ENCOUNTER — Encounter: Payer: Self-pay | Admitting: Family Medicine

## 2020-10-31 DIAGNOSIS — R059 Cough, unspecified: Secondary | ICD-10-CM

## 2020-10-31 MED ORDER — FLUTICASONE PROPIONATE HFA 44 MCG/ACT IN AERO: 2.0000 | INHALATION_SPRAY | Freq: Two times a day (BID) | RESPIRATORY_TRACT | 12 refills | Status: DC

## 2020-10-31 NOTE — Patient Instructions (Signed)
Given that you were just on antibiotics and these symptoms have only been here a few days, let's try to hold off on antibiotics for now.  Take the Mucinex twice a day, Allegra daily, Flonase nose spray daily, albuterol inhaler as needed.  I'm sending in an inhaler that has a steroid in it that you will use every day to decreased any inflammation and help open your airways.  If not feeling any better or if symptoms worsen over the next few days, please come see [place] in-person so we can listen to your lungs and do a more thorough assessment.   Hope you feel better soon! It was a pleasure meeting you!  Lollie Marrow Reola Calkins, DNP, FNP-C

## 2020-10-31 NOTE — Progress Notes (Signed)
Virtual Video Visit via MyChart Note  I connected with  Gloria Rivera on 10/31/20 at  2:00 PM EDT by the video enabled telemedicine application for MyChart, and verified that I am speaking with the correct person using two identifiers.   I introduced myself as a Publishing rights manager with the practice. We discussed the limitations of evaluation and management by telemedicine and the availability of in person appointments. The patient expressed understanding and agreed to proceed.  Participating parties in this visit include: The patient and the nurse practitioner listed.  The patient is: At home I am: In the office - [facility]  Subjective:    CC:  Chief Complaint  Patient presents with   Cough    HPI: Gloria Rivera is a 60 y.o. year old female presenting today via MyChart today for cough, congestion, rhinorrhea. Her history includes anxiety, ADHD, arthritis.  She had an in-person visit with PCP on 10/09/20 for sinusitis post-COVID (June 24th-July 7th)with sinus pressure, throat fullness, dental pain and dizziness; she was treated with Augmentin for 7 days.   About 3 days ago patient started with sneezing, nasal congestion, rhinorrhea, coughing.  States she is having coughing fits off and on all day for the past few days.  Her sneezing and runny nose have mostly improved.  Her only symptom now is the ongoing cough which can be productive at times.  Her chest and abdominal muscles are now sore from all the coughing.  She has had to use her albuterol inhaler at least 3 times in the past 24 hours.  Reports she does have some slight fatigue, however she has not had any fevers, chest pain, itchy watery eyes, itchy or sore throat, sinus pressure, wheezing, nausea, vomiting, diarrhea, GI/GU symptoms, loss of taste/smell.  Reports she does occasionally take Allegra but has not been using it consistently.  States she does have a history of getting bronchitis a time or two per  year.     Past medical history, Surgical history, Family history not pertinant except as noted below, Social history, Allergies, and medications have been entered into the medical record, reviewed, and corrections made.   Review of Systems:  All review of systems negative except what is listed in the HPI   Objective:    General:  Speaking clearly in complete sentences. Absent shortness of breath noted.   Alert and oriented x3.   Normal judgment.  Absent acute distress.   Impression and Recommendations:   1. Cough Possible viral cough/bronchitis.  Since she was recently on an antibiotics would prefer to start conservatively.  Recommend she continue the Mucinex twice a day, Allegra every day, albuterol as needed.  Reports she does have some leftover Tessalon Perles -I told her this was fine to go ahead and use as needed.  She is not interested in a cough syrup as she does not like the taste.  We will go ahead and send in a Flovent inhaler to see if this makes any difference as well.  Recommend she continue rest, hydration, humidifier use, warm liquids with honey and lemon, over-the-counter cough/cold/analgesics. Patient aware of signs/symptoms requiring further/urgent evaluation. In-person evaluation if not feeling any better by next week.   - fluticasone (FLOVENT HFA) 44 MCG/ACT inhaler; Inhale 2 puffs into the lungs in the morning and at bedtime.  Dispense: 1 each; Refill: 12    Follow-up if symptoms worsen or fail to improve.    I discussed the assessment and treatment plan with  the patient. The patient was provided an opportunity to ask questions and all were answered. The patient agreed with the plan and demonstrated an understanding of the instructions.   The patient was advised to call back or seek an in-person evaluation if the symptoms worsen or if the condition fails to improve as anticipated.  I spent 20 minutes dedicated to the care of this patient on the date of this  encounter to include pre-visit chart review of prior notes and results, face-to-face time with the patient, and post-visit ordering of testing as indicated.   Lollie Marrow Reola Calkins, DNP, FNP-C

## 2020-11-17 ENCOUNTER — Ambulatory Visit (HOSPITAL_BASED_OUTPATIENT_CLINIC_OR_DEPARTMENT_OTHER)
Admission: RE | Admit: 2020-11-17 | Discharge: 2020-11-17 | Disposition: A | Discharge status: Home / Self Care | Payer: BC Managed Care – PPO | Source: Ambulatory Visit | Attending: Obstetrics & Gynecology | Admitting: Obstetrics & Gynecology

## 2020-11-17 ENCOUNTER — Other Ambulatory Visit: Payer: Self-pay

## 2020-11-17 DIAGNOSIS — M8580 Other specified disorders of bone density and structure, unspecified site: Secondary | ICD-10-CM | POA: Diagnosis not present

## 2020-11-17 DIAGNOSIS — Z1231 Encounter for screening mammogram for malignant neoplasm of breast: Secondary | ICD-10-CM

## 2020-11-19 ENCOUNTER — Encounter (HOSPITAL_BASED_OUTPATIENT_CLINIC_OR_DEPARTMENT_OTHER): Payer: Self-pay | Admitting: *Deleted

## 2020-11-19 ENCOUNTER — Telehealth (HOSPITAL_BASED_OUTPATIENT_CLINIC_OR_DEPARTMENT_OTHER): Payer: Self-pay | Admitting: Obstetrics & Gynecology

## 2020-11-19 NOTE — Telephone Encounter (Signed)
Pt called in regards to lab results. Results and recommendations reviewed with pt

## 2020-11-19 NOTE — Telephone Encounter (Signed)
Patient return your call any stated that she available anytime today to please call her back .

## 2020-12-12 ENCOUNTER — Ambulatory Visit (INDEPENDENT_AMBULATORY_CARE_PROVIDER_SITE_OTHER): Payer: BC Managed Care – PPO | Admitting: Sports Medicine

## 2020-12-12 DIAGNOSIS — M19042 Primary osteoarthritis, left hand: Secondary | ICD-10-CM

## 2020-12-12 DIAGNOSIS — M19041 Primary osteoarthritis, right hand: Secondary | ICD-10-CM

## 2020-12-12 MED ORDER — ACETAMINOPHEN ER 650 MG PO TBCR: 650.0000 mg | EXTENDED_RELEASE_TABLET | Freq: Three times a day (TID) | ORAL | 3 refills | Status: DC | PRN

## 2020-12-12 MED ORDER — CELECOXIB 200 MG PO CAPS: ORAL_CAPSULE | ORAL | 11 refills | Status: DC

## 2020-12-12 NOTE — Assessment & Plan Note (Signed)
Kalenna returns, she is a very pleasant 60 year old female with polyarthralgia, predominantly small joints of the fingers, we did a full rheumatoid and lupus work-up which was negative, she unfortunately has been off of Celebrex, has been trying some Motrin which is not effective. Discontinue Motrin, switching back to Celebrex 200 twice daily, she can also add arthritis strength Tylenol. She already has her hand therapy, she ices her hands as well, both of which are helpful. I do not think we are at the juncture where we need injections just yet. We can certainly add tramadol in the future for breakthrough pain should it be needed.

## 2020-12-12 NOTE — Progress Notes (Signed)
    Procedures performed today:    None.  Independent interpretation of notes and tests performed by another provider:   None.  Brief History, Exam, Impression, and Recommendations:    Primary osteoarthritis of both hands Delsy returns, she is a very pleasant 61 year old female with polyarthralgia, predominantly small joints of the fingers, we did a full rheumatoid and lupus work-up which was negative, she unfortunately has been off of Celebrex, has been trying some Motrin which is not effective. Discontinue Motrin, switching back to Celebrex 200 twice daily, she can also add arthritis strength Tylenol. She already has her hand therapy, she ices her hands as well, both of which are helpful. I do not think we are at the juncture where we need injections just yet. We can certainly add tramadol in the future for breakthrough pain should it be needed.  Chronic process with exacerbation and pharmacologic intervention  ___________________________________________ Ihor Austin. Benjamin Stain, M.D., ABFM., CAQSM. Primary Care and Sports Medicine  [facility]  Adjunct Instructor of Family Medicine  [employer]

## 2020-12-25 ENCOUNTER — Ambulatory Visit: Payer: BC Managed Care – PPO | Admitting: Family Medicine

## 2020-12-25 ENCOUNTER — Other Ambulatory Visit: Payer: Self-pay

## 2020-12-25 ENCOUNTER — Encounter: Payer: Self-pay | Admitting: Family Medicine

## 2020-12-25 VITALS — BP 129/85 | HR 73 | Wt 163.1 lb

## 2020-12-25 DIAGNOSIS — L659 Nonscarring hair loss, unspecified: Secondary | ICD-10-CM | POA: Diagnosis not present

## 2020-12-25 NOTE — Progress Notes (Signed)
Acute Office Visit  Subjective:    Patient ID: Gloria Rivera, female    DOB: [birth year], 60 y.o.   MRN: [id number]  Chief Complaint  Patient presents with   Hair/Scalp Problem    HPI Patient is in today for hair loss.  Patient reports she feels like half of hair has fallen out within the past 5 weeks or so. States every time she brushes it, it looks like she has a birds nest in her hands made of her hair. She has not had any changes in products, skin or nail changes, heat/cold intolerance, weight changes. States the hair loss is not patchy, just generalized thinning and loss. She does feel like her face has been breaking out slightly more than usual, but otherwise no other changes. Reports lab work was normal this past April, but her mom did have thyroid dysfunction. Patient reports she had COVID in June, approximately 3 months ago.    Past Medical History:  Diagnosis Date   Adult acne    Anxiety    Arthritis    both hands   Fracture of right ankle    Stomach ulcer     Past Surgical History:  Procedure Laterality Date   ABLATION  11/2006   ANKLE FRACTURE SURGERY Right 12/2006   BREAST LUMPECTOMY  1997   Benign   JOINT REPLACEMENT     KNEE ARTHROSCOPY Left 1995   Osteoma Removal  12/2012   TONSILLECTOMY      Family History  Problem Relation Age of Onset   Heart attack Maternal Uncle    Breast cancer Maternal Grandmother    COPD Father     Social History   Socioeconomic History   Marital status: Married    Spouse name: Not on file   Number of children: 3   Years of education: Not on file   Highest education level: Not on file  Occupational History   Not on file  Tobacco Use   Smoking status: Never   Smokeless tobacco: Never  Vaping Use   Vaping Use: Never used  Substance and Sexual Activity   Alcohol use: Yes    Alcohol/week: 2.0 standard drinks    Types: 2 Glasses of wine per week   Drug use: Never   Sexual activity: Not Currently    Partners:  Male    Birth control/protection: Post-menopausal    Comment: ablation  Other Topics Concern   Not on file  Social History Narrative   Not on file   Social Determinants of Health   Financial Resource Strain: Not on file  Food Insecurity: Not on file  Transportation Needs: Not on file  Physical Activity: Not on file  Stress: Not on file  Social Connections: Not on file  Intimate Partner Violence: Not on file    Outpatient Medications Prior to Visit  Medication Sig Dispense Refill   acetaminophen (TYLENOL) 650 MG CR tablet Take 1 tablet (650 mg total) by mouth every 8 (eight) hours as needed for pain. 90 tablet 3   calcium carbonate (OS-CAL) 1250 (500 Ca) MG chewable tablet Chew 1 tablet by mouth daily.     celecoxib (CELEBREX) 200 MG capsule One to 2 tablets by mouth daily as needed for pain. 60 capsule 11   Estradiol-Norethindrone Acet 0.5-0.1 MG tablet Take 1 tablet by mouth daily. 84 tablet 4   fluticasone (FLOVENT HFA) 44 MCG/ACT inhaler Inhale 2 puffs into the lungs in the morning and at bedtime. 1 each 12  Multiple Vitamin (MULTIVITAMIN) capsule Take 1 capsule by mouth daily.     venlafaxine XR (EFFEXOR-XR) 150 MG 24 hr capsule Take 1 capsule (150 mg total) by mouth daily with breakfast. 90 capsule 4   No facility-administered medications prior to visit.    Allergies  Allergen Reactions   Bee Venom     Review of Systems All review of systems negative except what is listed in the HPI     Objective:    Physical Exam Vitals reviewed.  Constitutional:      Appearance: Normal appearance.  HENT:     Head: Normocephalic and atraumatic.     Comments: Hair thinning, see picture of hair that fell out when she removed ponytail, normal appearance of follicular ostia Neck:     Thyroid: No thyroid mass, thyromegaly or thyroid tenderness.  Cardiovascular:     Rate and Rhythm: Normal rate and regular rhythm.     Heart sounds: Normal heart sounds.  Pulmonary:     Effort:  Pulmonary effort is normal.     Breath sounds: Normal breath sounds.  Musculoskeletal:     Cervical back: Normal range of motion and neck supple.  Skin:    General: Skin is warm and dry.     Findings: No rash.  Neurological:     Mental Status: She is alert and oriented to person, place, and time.  Psychiatric:        Mood and Affect: Mood normal.        Behavior: Behavior normal.        Thought Content: Thought content normal.        Judgment: Judgment normal.         BP 129/85 (BP Location: Left Arm, Patient Position: Sitting, Cuff Size: Normal)   Pulse 73   Wt 163 lb 1.3 oz (74 kg)   BMI 26.32 kg/m  Wt Readings from Last 3 Encounters:  12/25/20 163 lb 1.3 oz (74 kg)  10/09/20 155 lb 12.8 oz (70.7 kg)  09/08/20 160 lb 9.6 oz (72.8 kg)    Health Maintenance Due  Topic Date Due   HIV Screening  Never done   COLONOSCOPY (Pts 45-53yrs Insurance coverage will need to be confirmed)  Never done    There are no preventive care reminders to display for this patient.   Lab Results  Component Value Date   TSH 0.928 07/24/2020   Lab Results  Component Value Date   WBC 7.2 07/24/2020   HGB 13.2 07/24/2020   HCT 40.1 07/24/2020   MCV 89.7 07/24/2020   PLT 415 (H) 07/24/2020   Lab Results  Component Value Date   NA 134 (L) 08/17/2019   K 4.2 08/17/2019   CO2 28 08/17/2019   GLUCOSE 94 08/17/2019   BUN 10 08/17/2019   CREATININE 0.62 08/17/2019   BILITOT 0.4 08/17/2019   ALKPHOS 110 03/19/2019   AST 21 08/17/2019   ALT 19 08/17/2019   PROT 6.6 08/17/2019   ALBUMIN 4.4 03/19/2019   CALCIUM 9.2 08/17/2019   Lab Results  Component Value Date   CHOL 131 03/19/2019   Lab Results  Component Value Date   HDL 51 03/19/2019   Lab Results  Component Value Date   LDLCALC 67 03/19/2019   Lab Results  Component Value Date   TRIG 62 03/19/2019   Lab Results  Component Value Date   CHOLHDL 2.6 03/19/2019   No results found for: HGBA1C     Assessment &  Plan:  1. Hair loss Basic labs today including thyroid workup. Will let her know results. Encouraged gentle hair care, not brushing wet hair, sleeping on wet hair, pulling air up too tightly. Also considering potential of post-COVID hair loss: [email or web address].[email or web address]  - TSH + free T4 - COMPLETE METABOLIC PANEL WITH GFR - CBC  Patient aware of signs/symptoms requiring further/urgent evaluation.  Follow-up pending lab results or as needed.   Lollie Marrow Reola Calkins, DNP, FNP-C

## 2020-12-26 ENCOUNTER — Encounter (HOSPITAL_BASED_OUTPATIENT_CLINIC_OR_DEPARTMENT_OTHER): Payer: Self-pay

## 2020-12-26 LAB — COMPLETE METABOLIC PANEL WITH GFR
AG Ratio: 1.7 (calc) (ref 1.0–2.5)
ALT: 20 U/L (ref 6–29)
AST: 20 U/L (ref 10–35)
Albumin: 4.2 g/dL (ref 3.6–5.1)
Alkaline phosphatase (APISO): 89 U/L (ref 37–153)
BUN: 8 mg/dL (ref 7–25)
CO2: 30 mmol/L (ref 20–32)
Calcium: 9 mg/dL (ref 8.6–10.4)
Chloride: 101 mmol/L (ref 98–110)
Creat: 0.55 mg/dL (ref 0.50–1.05)
Globulin: 2.5 g/dL (calc) (ref 1.9–3.7)
Glucose, Bld: 88 mg/dL (ref 65–99)
Potassium: 3.7 mmol/L (ref 3.5–5.3)
Sodium: 138 mmol/L (ref 135–146)
Total Bilirubin: 0.4 mg/dL (ref 0.2–1.2)
Total Protein: 6.7 g/dL (ref 6.1–8.1)
eGFR: 104 mL/min/{1.73_m2} (ref >=60)

## 2020-12-26 LAB — CBC
HCT: 39.6 % (ref 35.0–45.0)
Hemoglobin: 12.8 g/dL (ref 11.7–15.5)
MCH: 29.5 pg (ref 27.0–33.0)
MCHC: 32.3 g/dL (ref 32.0–36.0)
MCV: 91.2 fL (ref 80.0–100.0)
MPV: 9.9 fL (ref 7.5–12.5)
Platelets: 414 10*3/uL — ABNORMAL HIGH (ref 140–400)
RBC: 4.34 10*6/uL (ref 3.80–5.10)
RDW: 12.3 % (ref 11.0–15.0)
WBC: 7.7 10*3/uL (ref 3.8–10.8)

## 2020-12-26 LAB — TSH+FREE T4: TSH W/REFLEX TO FT4: 1.44 mIU/L (ref 0.40–4.50)

## 2021-03-12 HISTORY — PX: VITRECTOMY: SHX106

## 2021-04-20 ENCOUNTER — Encounter: Payer: Self-pay | Admitting: Family Medicine

## 2021-04-20 ENCOUNTER — Other Ambulatory Visit: Payer: Self-pay

## 2021-04-20 ENCOUNTER — Ambulatory Visit: Payer: BC Managed Care – PPO | Admitting: Family Medicine

## 2021-04-20 VITALS — BP 129/83 | HR 77 | Resp 16 | Ht 66.0 in | Wt 159.0 lb

## 2021-04-20 DIAGNOSIS — R55 Syncope and collapse: Secondary | ICD-10-CM | POA: Diagnosis not present

## 2021-04-20 NOTE — Progress Notes (Signed)
Established Patient Office Visit  Subjective:  Patient ID: Gloria Rivera, female    DOB: 15-Feb-1961  Age: 61 y.o. MRN: [id number]  CC:  Chief Complaint  Patient presents with   Loss of Consciousness    Patient stated she hit her right hand on the door and immediately started getting blurred vision and loss of consciousness. Patient also stated she started having numbness/tingling and stiffness in arms and hands. Patient stated this occurred on 04/19/21    HPI Gloria Rivera presents for ED follow up.  Patient stated she hit her right hand on the door and immediately started getting blurred vision and loss of consciousness.  She actually urinated on herself.  Her husband helped her up to the toilet in the bathroom.  While sitting there she started getting significant numbness and tingling from her elbows down and her knees down.  And noticed that her hands were short of drawing to the point she really could not move them for almost 13 minutes.  Her daughter who is a PA in neurology's came to help assess her.  Patient also stated she started having numbness/tingling and stiffness in arms and hands. Patient stated this occurred on 04/19/21.  EMS was called after the event yesterday and her blood sugar was normal, blood pressure was up just a little bit, normally she has low blood pressure.  And they did an EKG which they told her was normal as well.  Says it was otherwise a very normal day.  Nothing unusual out of routine.  Past Medical History:  Diagnosis Date   Adult acne    Anxiety    Arthritis    both hands   Fracture of right ankle    Stomach ulcer     Past Surgical History:  Procedure Laterality Date   ABLATION  11/2006   ANKLE FRACTURE SURGERY Right 12/2006   BREAST LUMPECTOMY  1997   Benign   JOINT REPLACEMENT     KNEE ARTHROSCOPY Left 1995   Osteoma Removal  12/2012   TONSILLECTOMY      Family History  Problem Relation Age of Onset   Heart attack Maternal Uncle     Breast cancer Maternal Grandmother    COPD Father     Social History   Socioeconomic History   Marital status: Married    Spouse name: Not on file   Number of children: 3   Years of education: Not on file   Highest education level: Not on file  Occupational History   Not on file  Tobacco Use   Smoking status: Never   Smokeless tobacco: Never  Vaping Use   Vaping Use: Never used  Substance and Sexual Activity   Alcohol use: Yes    Alcohol/week: 2.0 standard drinks    Types: 2 Glasses of wine per week   Drug use: Never   Sexual activity: Not Currently    Partners: Male    Birth control/protection: Post-menopausal    Comment: ablation  Other Topics Concern   Not on file  Social History Narrative   Not on file   Social Determinants of Health   Financial Resource Strain: Not on file  Food Insecurity: Not on file  Transportation Needs: Not on file  Physical Activity: Not on file  Stress: Not on file  Social Connections: Not on file  Intimate Partner Violence: Not on file    Outpatient Medications Prior to Visit  Medication Sig Dispense Refill   acetaminophen (TYLENOL) 650  MG CR tablet Take 1 tablet (650 mg total) by mouth every 8 (eight) hours as needed for pain. 90 tablet 3   ampicillin (PRINCIPEN) 500 MG capsule Take 500 mg by mouth daily as needed.     celecoxib (CELEBREX) 200 MG capsule One to 2 tablets by mouth daily as needed for pain. 60 capsule 11   Estradiol-Norethindrone Acet 0.5-0.1 MG tablet Take 1 tablet by mouth daily. 84 tablet 4   fluticasone (FLOVENT HFA) 44 MCG/ACT inhaler Inhale 2 puffs into the lungs in the morning and at bedtime. 1 each 12   Multiple Vitamin (MULTIVITAMIN) capsule Take 1 capsule by mouth daily.     venlafaxine XR (EFFEXOR-XR) 150 MG 24 hr capsule Take 1 capsule (150 mg total) by mouth daily with breakfast. 90 capsule 4   calcium carbonate (OS-CAL) 1250 (500 Ca) MG chewable tablet Chew 1 tablet by mouth daily.     No  facility-administered medications prior to visit.    Allergies  Allergen Reactions   Bee Venom     ROS Review of Systems    Objective:    Physical Exam Constitutional:      Appearance: Normal appearance. She is well-developed.  HENT:     Head: Normocephalic and atraumatic.     Right Ear: Tympanic membrane, ear canal and external ear normal.     Left Ear: Tympanic membrane, ear canal and external ear normal.     Nose: Nose normal.     Mouth/Throat:     Mouth: Mucous membranes are moist.     Pharynx: Oropharynx is clear. No oropharyngeal exudate or posterior oropharyngeal erythema.  Eyes:     Conjunctiva/sclera: Conjunctivae normal.     Pupils: Pupils are equal, round, and reactive to light.  Neck:     Thyroid: No thyromegaly.  Cardiovascular:     Rate and Rhythm: Normal rate and regular rhythm.     Heart sounds: Normal heart sounds.  Pulmonary:     Effort: Pulmonary effort is normal.     Breath sounds: Normal breath sounds. No wheezing.  Musculoskeletal:     Cervical back: Neck supple.     Comments: Right hand and wrist with normal range of motion.  Wrist with normal range of motion.  She has some bruising and swelling on the dorsum of the hand.  She is tender directly over the swollen area.  Lymphadenopathy:     Cervical: No cervical adenopathy.  Skin:    General: Skin is warm and dry.  Neurological:     General: No focal deficit present.     Mental Status: She is alert. She is disoriented.     Gait: Gait normal.     Comments: Cranial nerves II through XII intact.  Psychiatric:        Mood and Affect: Mood normal.        Behavior: Behavior normal.        Thought Content: Thought content normal.    BP 129/83    Pulse 77    Resp 16    Ht 5' 6"  (1.676 m)    Wt 159 lb (72.1 kg)    SpO2 99%    BMI 25.66 kg/m  Wt Readings from Last 3 Encounters:  04/20/21 159 lb (72.1 kg)  12/25/20 163 lb 1.3 oz (74 kg)  10/09/20 155 lb 12.8 oz (70.7 kg)     Health Maintenance  Due  Topic Date Due   HIV Screening  Never done   COLONOSCOPY (Pts  45-67yr Insurance coverage will need to be confirmed)  Never done    There are no preventive care reminders to display for this patient.  Lab Results  Component Value Date   TSH 0.928 07/24/2020   Lab Results  Component Value Date   WBC 7.7 12/25/2020   HGB 12.8 12/25/2020   HCT 39.6 12/25/2020   MCV 91.2 12/25/2020   PLT 414 (H) 12/25/2020   Lab Results  Component Value Date   NA 138 12/25/2020   K 3.7 12/25/2020   CO2 30 12/25/2020   GLUCOSE 88 12/25/2020   BUN 8 12/25/2020   CREATININE 0.55 12/25/2020   BILITOT 0.4 12/25/2020   ALKPHOS 110 03/19/2019   AST 20 12/25/2020   ALT 20 12/25/2020   PROT 6.7 12/25/2020   ALBUMIN 4.4 03/19/2019   CALCIUM 9.0 12/25/2020   EGFR 104 12/25/2020   Lab Results  Component Value Date   CHOL 131 03/19/2019   Lab Results  Component Value Date   HDL 51 03/19/2019   Lab Results  Component Value Date   LDLCALC 67 03/19/2019   Lab Results  Component Value Date   TRIG 62 03/19/2019   Lab Results  Component Value Date   CHOLHDL 2.6 03/19/2019   No results found for: HGBA1C    Assessment & Plan:   Problem List Items Addressed This Visit   None Visit Diagnoses     Syncope, unspecified syncope type    -  Primary   Relevant Orders   CBC with Differential/Platelet   COMPLETE METABOLIC PANEL WITH GFR   TSH       Did do orthostatics today.  She had about a 13 point systolic drop from sitting to standing.  I really wonder if after she had been doing some laundry and bending over repetitively doing the laundry and then turned around and walked out and hit her hand if she may have had low blood pressure which then triggered some syncope after the injury.  We discussed doing some lab work today just to rule out anemia, electrolyte disturbance, or thyroid being off.  We will call with those results once available.  She says she feels normal today except from  some soreness on the back of her hand.  Right hand injury-is mostly a soft tissue injury or possibly a bone bruise but I do not think anything is fractured based on exam today.  But if not improving over the next couple weeks we can always get an x-ray.  No data found.   No orders of the defined types were placed in this encounter.   Follow-up: No follow-ups on file.    CBeatrice Lecher MD

## 2021-04-20 NOTE — Progress Notes (Signed)
Patient states she had Cologuard done with Dr. Hyacinth Meeker [facility].

## 2021-04-20 NOTE — Patient Instructions (Signed)
Make sure hydrating well. If at any point you feel like you are developing any new symptoms including lightheadedness, dizziness, slurring of speech etc. then please let us know. Should hopefully have your labs back tomorrow.

## 2021-04-21 LAB — CBC WITH DIFFERENTIAL/PLATELET
Absolute Monocytes: 836 cells/uL (ref 200–950)
Basophils Absolute: 59 cells/uL (ref 0–200)
Basophils Relative: 0.8 %
Eosinophils Absolute: 222 cells/uL (ref 15–500)
Eosinophils Relative: 3 %
HCT: 39.3 % (ref 35.0–45.0)
Hemoglobin: 13.1 g/dL (ref 11.7–15.5)
Lymphs Abs: 1598 cells/uL (ref 850–3900)
MCH: 29.6 pg (ref 27.0–33.0)
MCHC: 33.3 g/dL (ref 32.0–36.0)
MCV: 88.9 fL (ref 80.0–100.0)
MPV: 10.5 fL (ref 7.5–12.5)
Monocytes Relative: 11.3 %
Neutro Abs: 4684 cells/uL (ref 1500–7800)
Neutrophils Relative %: 63.3 %
Platelets: 411 10*3/uL — ABNORMAL HIGH (ref 140–400)
RBC: 4.42 10*6/uL (ref 3.80–5.10)
RDW: 12.4 % (ref 11.0–15.0)
Total Lymphocyte: 21.6 %
WBC: 7.4 10*3/uL (ref 3.8–10.8)

## 2021-04-21 LAB — COMPLETE METABOLIC PANEL WITH GFR
AG Ratio: 1.8 (calc) (ref 1.0–2.5)
ALT: 20 U/L (ref 6–29)
AST: 23 U/L (ref 10–35)
Albumin: 4.2 g/dL (ref 3.6–5.1)
Alkaline phosphatase (APISO): 91 U/L (ref 37–153)
BUN: 9 mg/dL (ref 7–25)
CO2: 30 mmol/L (ref 20–32)
Calcium: 9.1 mg/dL (ref 8.6–10.4)
Chloride: 101 mmol/L (ref 98–110)
Creat: 0.67 mg/dL (ref 0.50–1.05)
Globulin: 2.4 g/dL (calc) (ref 1.9–3.7)
Glucose, Bld: 91 mg/dL (ref 65–99)
Potassium: 4.2 mmol/L (ref 3.5–5.3)
Sodium: 137 mmol/L (ref 135–146)
Total Bilirubin: 0.4 mg/dL (ref 0.2–1.2)
Total Protein: 6.6 g/dL (ref 6.1–8.1)
eGFR: 99 mL/min/{1.73_m2} (ref >=60)

## 2021-04-21 LAB — TSH: TSH: 1.33 mIU/L (ref 0.40–4.50)

## 2021-04-21 NOTE — Progress Notes (Signed)
Hi Gloria Rivera, your blood count overall looks good no sign of anemia.  No sign of infection.  Your platelets are mildly elevated but very similar to what they have been over the last 4 years.  So they are stable.  Electrolytes look great.  No abnormal glucose levels.  Kidney function and liver function are stable.  Your thyroid looks perfect at 1.3.  So far everything has checked out.  So I do want to just keep an eye on things over the next couple weeks if at any point you feel like you are having any new or unusual symptoms then please let me know.

## 2021-06-08 NOTE — Progress Notes (Unsigned)
°  HPI with pertinent ROS:   CC:   HPI:   I reviewed the past medical history, family history, social history, surgical history, and allergies today and no changes were needed.  Please see the problem list section below in epic for further details.   Physical exam:   General: Well Developed, well nourished, and in no acute distress.  Neuro: Alert and oriented x3, extra-ocular muscles intact, sensation grossly intact.  HEENT: Normocephalic, atraumatic, pupils equal round reactive to light, neck supple, no masses, no lymphadenopathy, thyroid nonpalpable.  Skin: Warm and dry, no rashes. Cardiac: Regular rate and rhythm, no murmurs rubs or gallops, no lower extremity edema.  Respiratory: Clear to auscultation bilaterally. Not using accessory muscles, speaking in full sentences.  Impression and Recommendations:    1. Anxiety ***  2. Attention deficit hyperactivity disorder, combined type ***  3. Menopausal symptom ***   No follow-ups on file. ___________________________________________ Clearnce Sorrel, DNP, APRN, FNP-BC Primary Care and [place]

## 2021-06-09 ENCOUNTER — Ambulatory Visit: Payer: BC Managed Care – PPO | Admitting: Medical-Surgical

## 2021-06-09 ENCOUNTER — Encounter: Payer: Self-pay | Admitting: Medical-Surgical

## 2021-06-09 ENCOUNTER — Other Ambulatory Visit: Payer: Self-pay

## 2021-06-09 VITALS — BP 123/86 | HR 73 | Resp 16 | Ht 66.0 in | Wt 159.0 lb

## 2021-06-09 DIAGNOSIS — F902 Attention-deficit hyperactivity disorder, combined type: Secondary | ICD-10-CM | POA: Diagnosis not present

## 2021-06-09 DIAGNOSIS — R300 Dysuria: Secondary | ICD-10-CM | POA: Diagnosis not present

## 2021-06-09 DIAGNOSIS — F419 Anxiety disorder, unspecified: Secondary | ICD-10-CM

## 2021-06-09 DIAGNOSIS — N951 Menopausal and female climacteric states: Secondary | ICD-10-CM | POA: Diagnosis not present

## 2021-06-09 DIAGNOSIS — N760 Acute vaginitis: Secondary | ICD-10-CM

## 2021-06-09 DIAGNOSIS — Z7689 Persons encountering health services in other specified circumstances: Secondary | ICD-10-CM | POA: Diagnosis not present

## 2021-06-09 DIAGNOSIS — R635 Abnormal weight gain: Secondary | ICD-10-CM

## 2021-06-09 LAB — WET PREP FOR TRICH, YEAST, CLUE
MICRO NUMBER:: 13128187
Specimen Quality: ADEQUATE

## 2021-06-09 LAB — POCT URINALYSIS DIP (CLINITEK)
Bilirubin, UA: NEGATIVE
Glucose, UA: NEGATIVE mg/dL
Ketones, POC UA: NEGATIVE mg/dL
Nitrite, UA: NEGATIVE
POC PROTEIN,UA: NEGATIVE
Spec Grav, UA: 1.02 (ref 1.010–1.025)
Urobilinogen, UA: 0.2 E.U./dL
pH, UA: 7 (ref 5.0–8.0)

## 2021-06-09 MED ORDER — BUPROPION HCL ER (XL) 150 MG PO TB24: 150.0000 mg | ORAL_TABLET | ORAL | 0 refills | Status: DC

## 2021-06-09 NOTE — Progress Notes (Signed)
Medical screening examination/treatment was performed by qualified nurse practitioner student and as supervising provider I was immediately available for consultation/collaboration. I have reviewed documentation and agree with assessment and plan. ° °Marcie Shearon L. Taleia Sadowski, DNP, APRN, FNP-BC °[facility] °Primary Care and Sports Medicine ° °

## 2021-06-09 NOTE — Patient Instructions (Addendum)
Start Wellbutrin 150mg  once daily.  ? ?Weight loss tips and tricks: ? ?1.  Make sure to drink at least 64 ounces of water every day. ?2.  Avoid alcohol. ?3.  Avoid eating within 3 hours of going to bed. ?4.  Cut out sugary drinks such as sweet tea, regular sodas, energy drinks, etc. ?5.  Make sure you are getting enough sleep every night. ?6.  Start making changes by cutting back portion sizes by 1/3. ?7.  Keep a food diary to help identify areas for improvement and promote awareness of bad habits. ?8.  Increase your activity.  Choose something you like to do that is fun for you so you are more likely to stick with it. ?9.  Do not forget your protein! ?10.  Measure your neck, upper arms, waist, hips, and thighs and write those measurements down somewhere before you start your weight loss journey.  Changes in your measurements will tell a far more accurate story than the number on a scale. ?11.  As you go, pay attention to how your clothes fit! ?12.  Weigh yourself as often or as little as you need to.  Some folks do better weighing every day while others do better with once a week. ?13.  Do not get discouraged!  Weight loss efforts are meant to be lifestyle changes.  Once you stop a medication (if you are taking 1), your behaviors and habits will determine if you maintain your weight loss or not. ? ?For those on medications: ? ?1.  Take your medication as prescribed every day first thing in the morning. ?2.  Common side effects include nausea and constipation.  To combat this, increased your daily water consumption.  Consider adding in a stool softener (available OTC) if needed.  Also increase fiber intake with vegetables and fruits. ?3.  If you have any side effects or concerns while taking medication, please do not hesitate to reach out to here at the office. ?4.  While on weight loss medications, we do require you to follow-up every 4 weeks with an in office visit.  Refills will not be called in early on  controlled substances. ? ?Good luck on your weight loss journey!  Have faith in your self and you will reach your goals! ? ?

## 2021-06-10 LAB — SURESWAB CT/NG/T. VAGINALIS
C. trachomatis RNA, TMA: NOT DETECTED
N. gonorrhoeae RNA, TMA: NOT DETECTED
Trichomonas vaginalis RNA: NOT DETECTED

## 2021-06-11 LAB — URINE CULTURE
MICRO NUMBER:: 13131792
Result:: NO GROWTH
SPECIMEN QUALITY:: ADEQUATE

## 2021-07-21 ENCOUNTER — Telehealth (INDEPENDENT_AMBULATORY_CARE_PROVIDER_SITE_OTHER): Payer: BC Managed Care – PPO | Admitting: Medical-Surgical

## 2021-07-21 ENCOUNTER — Encounter: Payer: Self-pay | Admitting: Medical-Surgical

## 2021-07-21 DIAGNOSIS — R635 Abnormal weight gain: Secondary | ICD-10-CM | POA: Diagnosis not present

## 2021-07-21 DIAGNOSIS — F419 Anxiety disorder, unspecified: Secondary | ICD-10-CM

## 2021-07-21 MED ORDER — BUPROPION HCL ER (XL) 300 MG PO TB24: 300.0000 mg | ORAL_TABLET | ORAL | 1 refills | Status: DC

## 2021-07-21 NOTE — Progress Notes (Signed)
Virtual Visit via Video Note ? ?I connected with Gloria Rivera on 07/21/21 at  1:40 PM EDT by a video enabled telemedicine application and verified that I am speaking with the correct person using two identifiers. ?  ?I discussed the limitations of evaluation and management by telemedicine and the availability of in person appointments. The patient expressed understanding and agreed to proceed. ? ?Patient location: home ?Provider locations: office ? ?Subjective:   ? ?CC: Wellbutrin follow up ? ?HPI: ?Pleasant 61 year old female presenting today via MyChart video visit for follow up since starting Wellbutrin about a month ago.  She has been doing well on the medication, tolerating it without side effects.  Notes that she is a little bit nicer these days.  Has not noted much of a difference in her overall appetite but for the most part likes the medication.  Feels that the medication dosage could be a little higher which may help with her appetite overall.  Denies SI/HI. ? ?Past medical history, Surgical history, Family history not pertinant except as noted below, Social history, Allergies, and medications have been entered into the medical record, reviewed, and corrections made.  ? ?Review of Systems: See HPI for pertinent positives and negatives.  ? ?Objective:   ? ?General: Speaking clearly in complete sentences without any shortness of breath.  Alert and oriented x3.  Normal judgment. No apparent acute distress. ? ?Impression and Recommendations:   ? ?1. Weight gain ?2. Anxiety ?She is doing well overall.  Increasing Wellbutrin to 300 mg daily.  Okay to double of her 150 mg tablets and take together or separate into 2 doses during the day depending on her preference.  New supply of 300 mg tablets sent to her pharmacy.  20 ? ?I discussed the assessment and treatment plan with the patient. The patient was provided an opportunity to ask questions and all were answered. The patient agreed with the plan and  demonstrated an understanding of the instructions. ?  ?The patient was advised to call back or seek an in-person evaluation if the symptoms worsen or if the condition fails to improve as anticipated. ? ?20 minutes of non-face-to-face time was provided during this encounter. ? ?Return in about 6 weeks (around 09/01/2021) for Weight/mood follow-up. ? ?Thayer Ohm, DNP, APRN, FNP-BC ?[facility] ?Primary Care and Sports Medicine ?

## 2021-07-27 ENCOUNTER — Ambulatory Visit (INDEPENDENT_AMBULATORY_CARE_PROVIDER_SITE_OTHER): Payer: BC Managed Care – PPO | Admitting: Obstetrics & Gynecology

## 2021-07-27 ENCOUNTER — Encounter (HOSPITAL_BASED_OUTPATIENT_CLINIC_OR_DEPARTMENT_OTHER): Payer: Self-pay | Admitting: Obstetrics & Gynecology

## 2021-07-27 VITALS — BP 126/70 | Ht 66.0 in | Wt 154.5 lb

## 2021-07-27 DIAGNOSIS — F419 Anxiety disorder, unspecified: Secondary | ICD-10-CM | POA: Diagnosis not present

## 2021-07-27 DIAGNOSIS — Z87440 Personal history of urinary (tract) infections: Secondary | ICD-10-CM | POA: Diagnosis not present

## 2021-07-27 DIAGNOSIS — N952 Postmenopausal atrophic vaginitis: Secondary | ICD-10-CM

## 2021-07-27 DIAGNOSIS — Z9229 Personal history of other drug therapy: Secondary | ICD-10-CM

## 2021-07-27 DIAGNOSIS — Z1211 Encounter for screening for malignant neoplasm of colon: Secondary | ICD-10-CM

## 2021-07-27 DIAGNOSIS — Z01419 Encounter for gynecological examination (general) (routine) without abnormal findings: Secondary | ICD-10-CM | POA: Diagnosis not present

## 2021-07-27 DIAGNOSIS — M8580 Other specified disorders of bone density and structure, unspecified site: Secondary | ICD-10-CM

## 2021-07-27 MED ORDER — VENLAFAXINE HCL ER 150 MG PO CP24: 150.0000 mg | ORAL_CAPSULE | Freq: Every day | ORAL | 4 refills | Status: DC

## 2021-07-27 MED ORDER — ESTRADIOL-NORETHINDRONE ACET 0.5-0.1 MG PO TABS: 1.0000 | ORAL_TABLET | Freq: Every day | ORAL | 4 refills | Status: DC

## 2021-07-27 NOTE — Progress Notes (Signed)
61 y.o. G41P3003 [name] here for annual exam.  Doing well.  Denies vaginal discharge or bleeding.  Had issues after having cataract and lens placement.  Ended up at [facility] and seeing specialist.  Finally, eyes are better.   ? ?On HRT.  Last year she cut in 1/2 and started having hot flashes within 3 days.  Is back on the full tablet.   ? ?No LMP recorded. Patient is postmenopausal.          ?Sexually active: Yes.    ?The current method of family planning is post menopausal status.    ? ?The pregnancy intention screening data noted above was reviewed. Potential methods of contraception were discussed. The patient elected to proceed with No data recorded.  ?Exercising: No.  The patient does not participate in regular exercise at present. ?Smoker:  no ? ?Health Maintenance: ?Pap:  06/2020, neg HR HPV ?History of abnormal Pap:  no ?MMG:  10/2020 ?Colonoscopy:  04/08/2018 ?BMD:   10/2020, -2.4 ?Screening Labs: Jan 2023 with Samuel Bouche NP ? ? reports that she has never smoked. She has never been exposed to tobacco smoke. She has never used smokeless tobacco. She reports current alcohol use of about 2.0 standard drinks per week. She reports that she does not use drugs. ? ?Past Medical History:  ?Diagnosis Date  ? Adult acne   ? Anxiety   ? Arthritis   ? both hands  ? Fracture of right ankle   ? Seizure ([place])   ? Stomach ulcer   ? ? ?Past Surgical History:  ?Procedure Laterality Date  ? ABLATION  11/2006  ? ANKLE FRACTURE SURGERY Right 12/2006  ? BREAST LUMPECTOMY  1997  ? Benign  ? CATARACT EXTRACTION Right 10/2020  ? with lens replacement  ? JOINT REPLACEMENT    ? KNEE ARTHROSCOPY Left 1995  ? Osteoma Removal  12/2012  ? TONSILLECTOMY    ? VITRECTOMY  03/12/2021  ? done at [facility]  ? ? ?Current Outpatient Medications  ?Medication Sig Dispense Refill  ? ampicillin (PRINCIPEN) 500 MG capsule Take 500 mg by mouth daily as needed.    ? buPROPion (WELLBUTRIN XL) 300 MG 24 hr tablet Take 1 tablet (300 mg  total) by mouth every morning. 90 tablet 1  ? Calcium Carbonate (CALCIUM 600 PO) Take by mouth.    ? celecoxib (CELEBREX) 200 MG capsule One to 2 tablets by mouth daily as needed for pain. 60 capsule 11  ? fluticasone (FLOVENT HFA) 44 MCG/ACT inhaler Inhale 2 puffs into the lungs in the morning and at bedtime. 1 each 12  ? Multiple Vitamin (MULTIVITAMIN) capsule Take 1 capsule by mouth daily.    ? VITAMIN D PO Take by mouth.    ? Estradiol-Norethindrone Acet 0.5-0.1 MG tablet Take 1 tablet by mouth daily. 84 tablet 4  ? venlafaxine XR (EFFEXOR-XR) 150 MG 24 hr capsule Take 1 capsule (150 mg total) by mouth daily with breakfast. 90 capsule 4  ? ?No current facility-administered medications for this visit.  ? ? ?Family History  ?Problem Relation Age of Onset  ? COPD Father   ? Breast cancer Maternal Grandmother   ? Heart attack Maternal Uncle   ? ? ?Gastrointestinal: negative ?Genitourinary:negative ? ?Exam:   ?BP 126/70 (BP Location: Left Arm, Patient Position: Sitting, Cuff Size: Normal)   Ht 5\' 6"  (1.676 m)   Wt 154 lb 8 oz (70.1 kg)   BMI 24.94 kg/m?   Height: 5\' 6"  (  167.6 cm) ? ?General appearance: alert, cooperative and appears stated age ?Head: Normocephalic, without obvious abnormality, atraumatic ?Neck: no adenopathy, supple, symmetrical, trachea midline and thyroid normal to inspection and palpation ?Lungs: clear to auscultation bilaterally ?Breasts: normal appearance, no masses or tenderness ?Heart: regular rate and rhythm ?Abdomen: soft, non-tender; bowel sounds normal; no masses,  no organomegaly ?Extremities: extremities normal, atraumatic, no cyanosis or edema ?Skin: Skin color, texture, turgor normal. No rashes or lesions ?Lymph nodes: Cervical, supraclavicular, and axillary nodes normal. ?No abnormal inguinal nodes palpated ?Neurologic: Grossly normal ? ? ?Pelvic: External genitalia:  no lesions ?             Urethra:  normal appearing urethra with no masses, tenderness or lesions ?              Bartholins and Skenes: normal    ?             Vagina: normal appearing vagina with normal color and no discharge, no lesions ?             Cervix: no lesions ?             Pap taken: No. ?Bimanual Exam:  Uterus:  normal size, contour, position, consistency, mobility, non-tender ?             Adnexa: normal adnexa and no mass, fullness, tenderness ?              Rectovaginal: Confirms ?              Anus:  normal sphincter tone, no lesions ? ?Chaperone, Octaviano Batty, CMA, was present for exam. ? ?Assessment/Plan: ?1. Well woman exam with routine gynecological exam ?- pap with neg HR HPV 2022.  Not indicated.   ?- MMG 10/2020 ?- cologuard 01/2019.  Ordered again today. ?- BMD 10/2020 ?- lab work done with Samuel Bouche, NP ? ?2. History of postmenopausal HRT ?- we discussed this today.  Will not make any changes.   ?- Estradiol-Norethindrone Acet 0.5-0.1 MG tablet; Take 1 tablet by mouth daily.  Dispense: 84 tablet; Refill: 4 ? ?3. Anxiety ?- venlafaxine XR (EFFEXOR-XR) 150 MG 24 hr capsule; Take 1 capsule (150 mg total) by mouth daily with breakfast.  Dispense: 90 capsule; Refill: 4 ? ?4. Osteopenia, unspecified location ?- plan to repeat next year ? ?

## 2021-08-27 LAB — COLOGUARD: COLOGUARD: NEGATIVE

## 2021-09-20 IMAGING — MG MM DIGITAL SCREENING BILAT W/ TOMO AND CAD
8 series · 9 of 24 positions shown · non-contrast
Comparison: Previous exam(s).

CLINICAL DATA: Screening.

EXAM:
DIGITAL SCREENING BILATERAL MAMMOGRAM WITH TOMOSYNTHESIS AND CAD
TECHNIQUE: Bilateral screening digital craniocaudal and mediolateral oblique
mammograms were obtained. Bilateral screening digital breast
tomosynthesis was performed. The images were evaluated with
computer-aided detection.

[R MLO synth-2D]
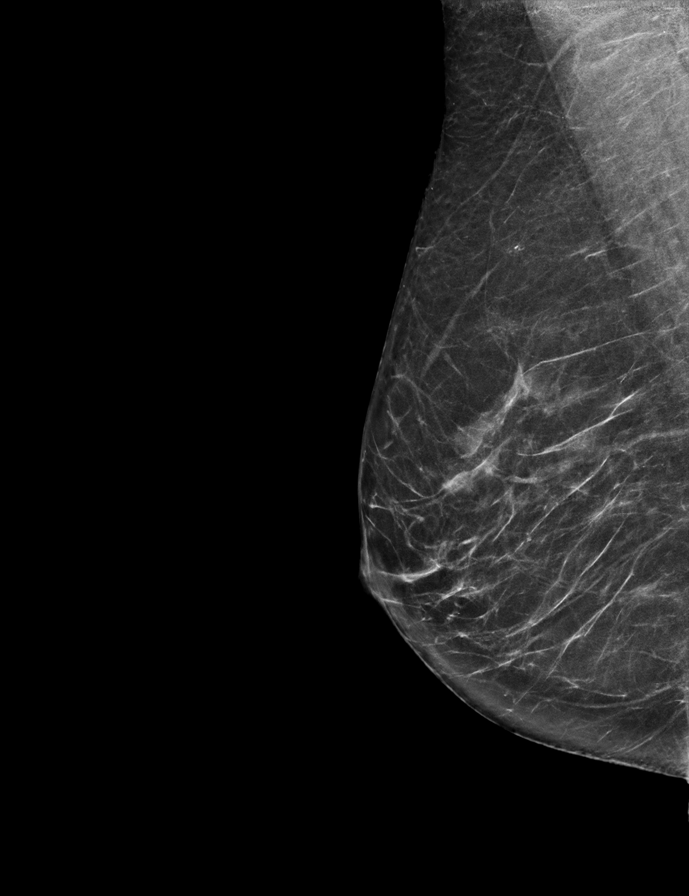

[L CC synth-2D]
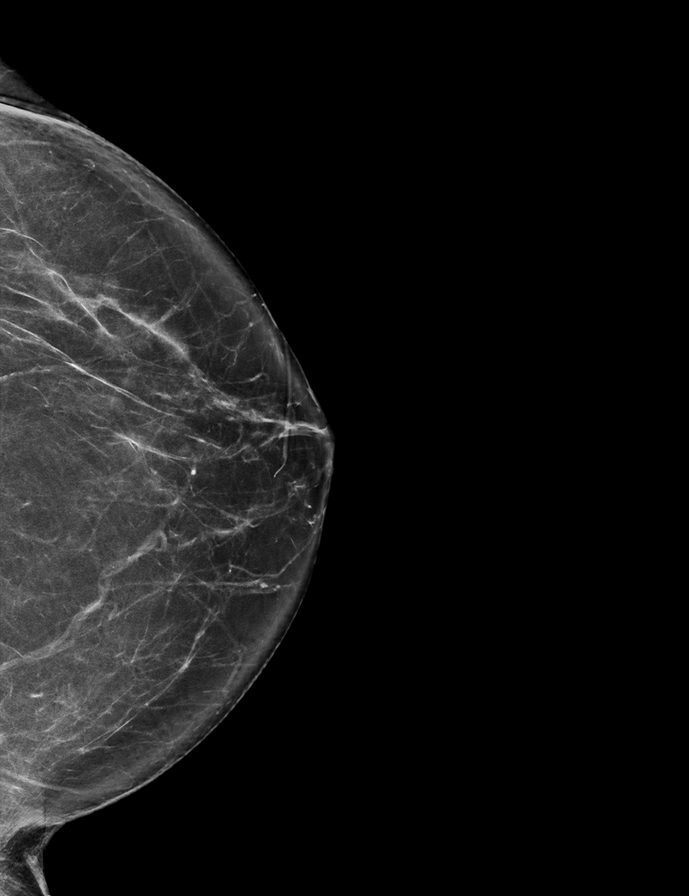

[R CC synth-2D]
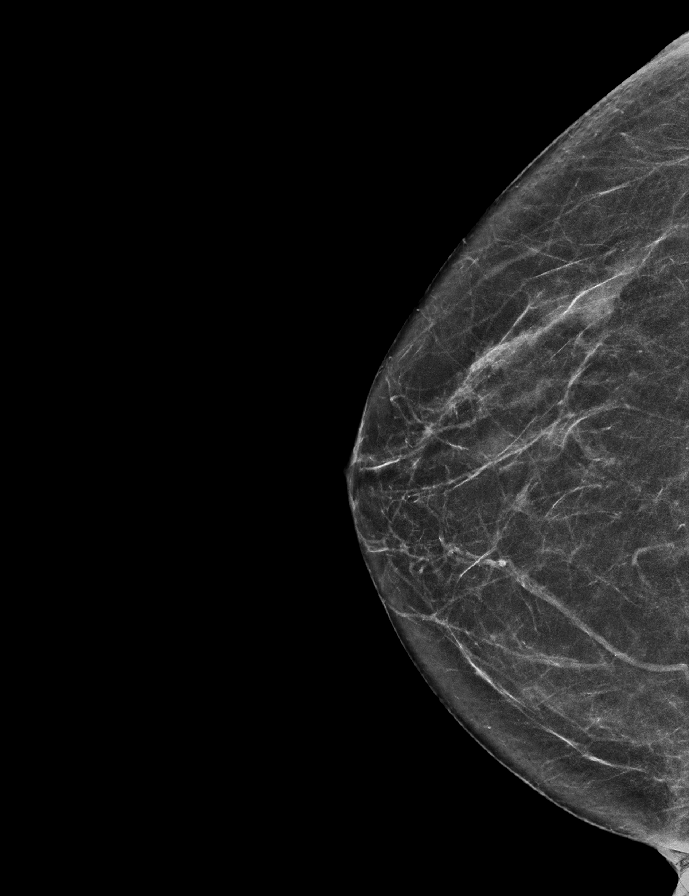

[L MLO synth-2D]
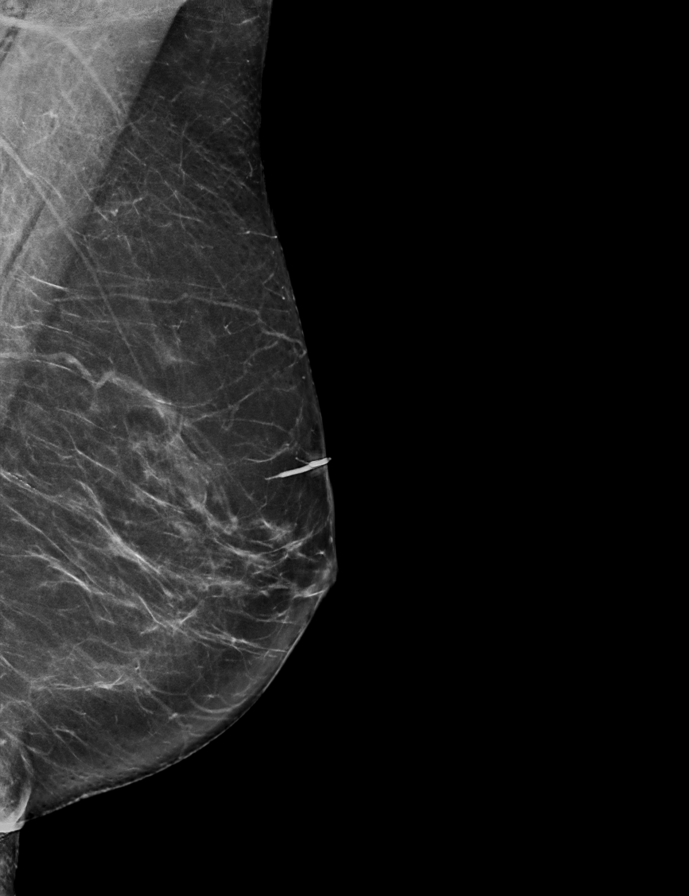

[L CC tomo · 2 of 70 frames shown]
[frame 23/70]
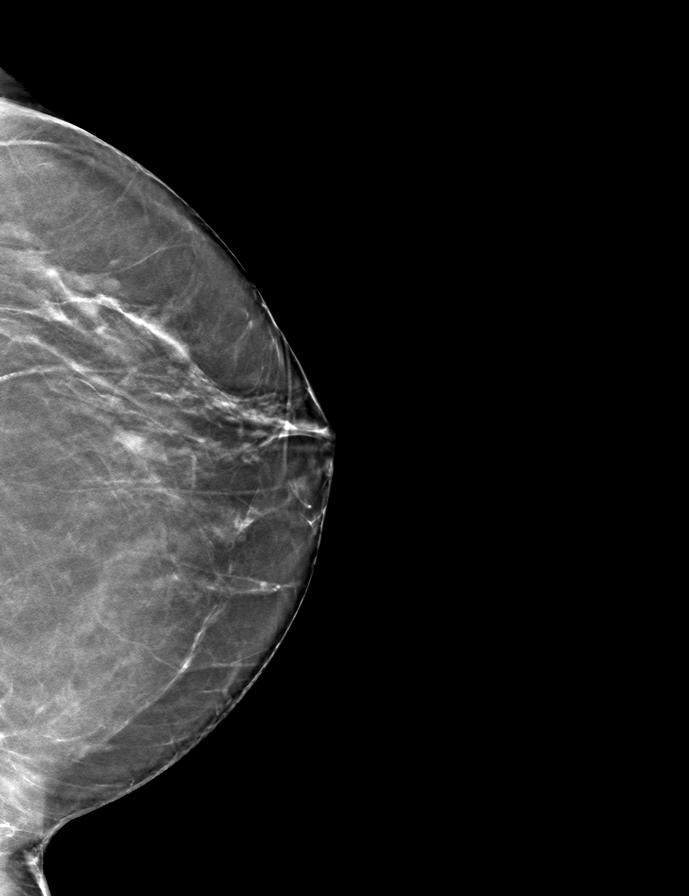
[frame 35/70]
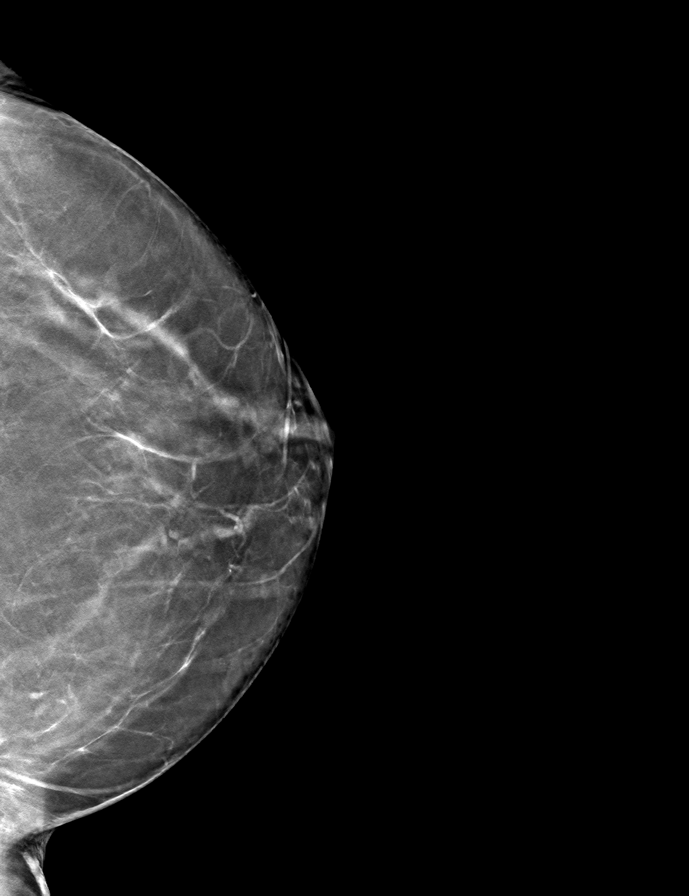

[L MLO tomo · tomo slice 34/67.0]
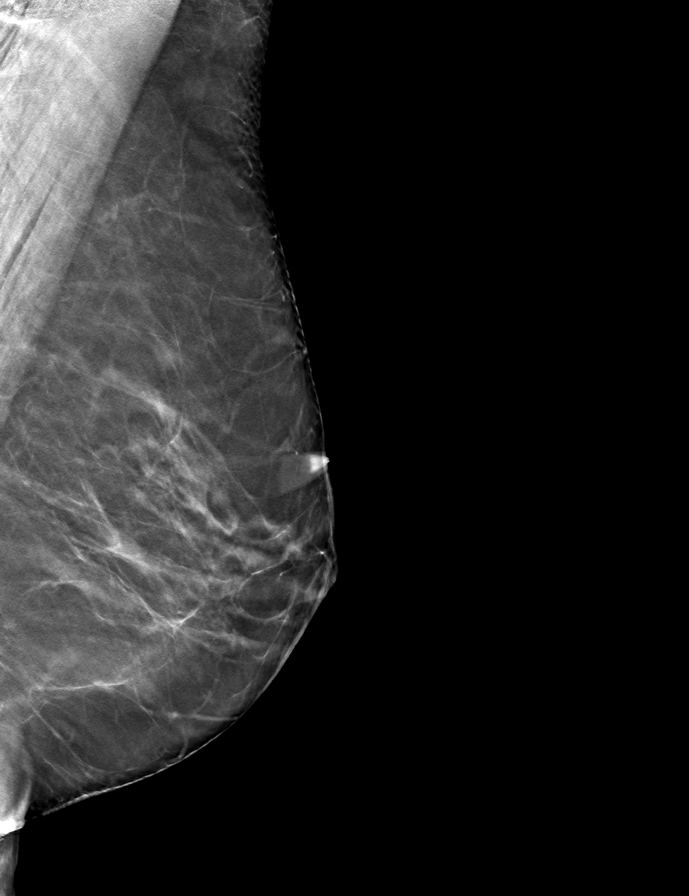

[R MLO tomo · tomo slice 31/62.0]
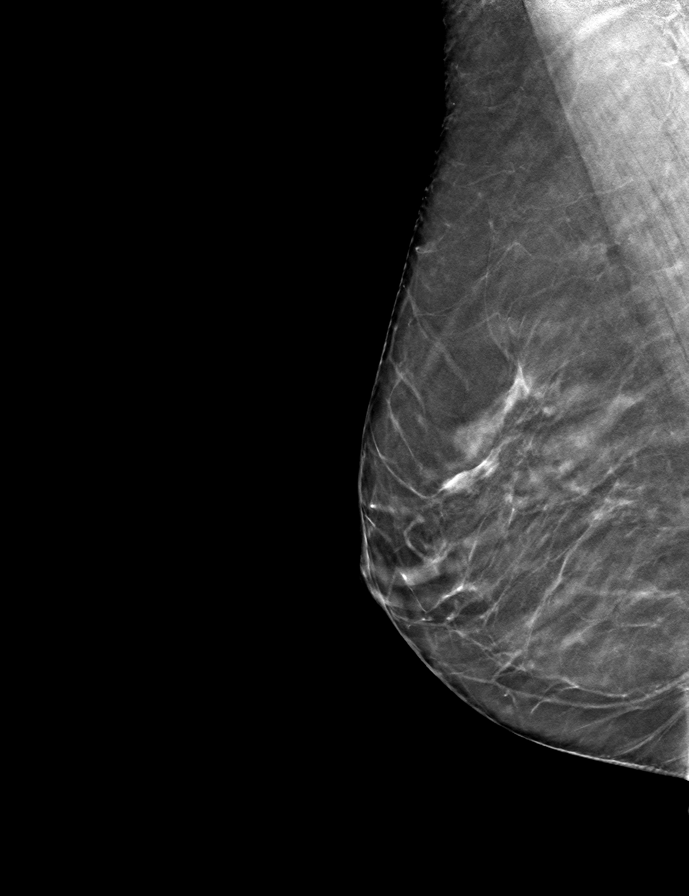

[R CC tomo · tomo slice 31/60.0]
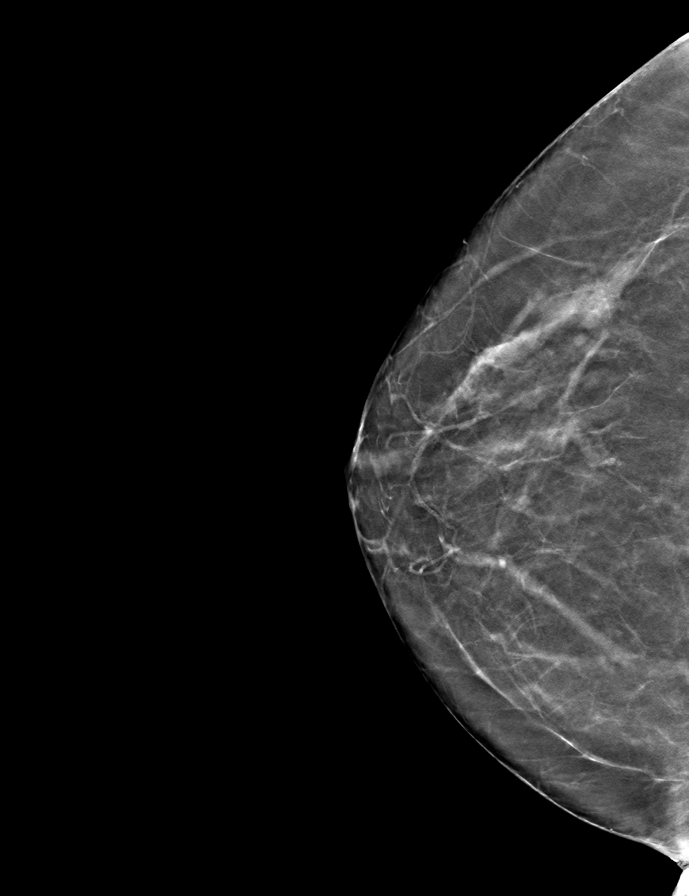

[9 of 24 positions shown; findings below may reference images not displayed]

ACR Breast Density Category b: There are scattered areas of
fibroglandular density.
FINDINGS: There are no findings suspicious for malignancy.
IMPRESSION: No mammographic evidence of malignancy. A result letter of this
screening mammogram will be mailed directly to the patient.

RECOMMENDATION:
Screening mammogram in one year. (Code:51-O-LD2)

BI-RADS CATEGORY  1: Negative.

## 2021-12-11 ENCOUNTER — Encounter: Payer: Self-pay | Admitting: Medical-Surgical

## 2021-12-11 ENCOUNTER — Telehealth (INDEPENDENT_AMBULATORY_CARE_PROVIDER_SITE_OTHER): Payer: BC Managed Care – PPO | Admitting: Medical-Surgical

## 2021-12-11 DIAGNOSIS — B070 Plantar wart: Secondary | ICD-10-CM | POA: Diagnosis not present

## 2021-12-11 DIAGNOSIS — M7022 Olecranon bursitis, left elbow: Secondary | ICD-10-CM | POA: Diagnosis not present

## 2021-12-11 DIAGNOSIS — G43009 Migraine without aura, not intractable, without status migrainosus: Secondary | ICD-10-CM

## 2021-12-11 DIAGNOSIS — R635 Abnormal weight gain: Secondary | ICD-10-CM | POA: Diagnosis not present

## 2021-12-11 MED ORDER — SUMATRIPTAN SUCCINATE 50 MG PO TABS: 100.0000 mg | ORAL_TABLET | ORAL | 5 refills | Status: DC | PRN

## 2021-12-11 MED ORDER — METHYLPREDNISOLONE 4 MG PO TBPK: ORAL_TABLET | ORAL | 0 refills | Status: DC

## 2021-12-11 NOTE — Progress Notes (Signed)
Virtual Visit via Video Note  I connected with Gloria Rivera on 12/11/21 at  1:00 PM EDT by a video enabled telemedicine application and verified that I am speaking with the correct person using two identifiers.   I discussed the limitations of evaluation and management by telemedicine and the availability of in person appointments. The patient expressed understanding and agreed to proceed.  Patient location: home Provider locations: office  Subjective:    CC: Migraines  HPI: Very pleasant 61 year old female presenting via MyChart video visit for the following:  Migraines: For last 20 years she has been having intermittent headaches that affect the back of her head.  Her prior PCP did do some work-up however no cause was found to explain her symptoms.  Notes that she has 1 of these episodes every 3 to 4 weeks that last for 12 to 15 hours.  They are getting worse and becoming more debilitating.  She does not have any nausea or vomiting but is positive for photophobia.  Unsure if she has any phonophobia as she is often at home alone in the quiet.  The worst part of it is that after the headache resolves, she is left with the"grog" where she is feeling groggy, brain fog, and overall fatigued.  Notes that her daughter is a medical provider and has strongly encouraged her to discuss this with her PCP as this is typical migraine presentation and there are many medications to help.  Has noted a plantar wart on the bottom of her left great toe that is making it painful to walk.  Is interested in having this addressed and reports that she plans to make an appointment for that.  She moved into a new house recently and notes that while she was in the process of moving, she ended up hitting her left elbow very hard on a box.  Notes that over the last few weeks her left elbow has been tender to touch and has become warm and red in the area.  She was previously started on Wellbutrin to help with appetite  suppression and weight loss however notes that she is no longer taking this medication because she tried it and it did not have any effect on her.   Past medical history, Surgical history, Family history not pertinant except as noted below, Social history, Allergies, and medications have been entered into the medical record, reviewed, and corrections made.   Review of Systems: See HPI for pertinent positives and negatives.   Objective:    General: Speaking clearly in complete sentences without any shortness of breath.  Alert and oriented x3.  Normal judgment. No apparent acute distress.  Impression and Recommendations:    1. Migraine without aura and without status migrainosus, not intractable Long-term symptoms that appear to be getting worse.  She would like to try an abortive medication so sending in Imitrex 50 mg at the onset of a headache with a repeat dose 2 hours later if needed.  Maximum dose in 1 day, 2 tablets.  Since her symptoms are fairly infrequent, we will try the abortive medication rather than initiating a preventative.  We may need to reevaluate if her symptoms become more frequent or if she would like to trial something to prevent the headaches from happening.  2. Plantar wart of left foot Advised her to schedule an appointment and we can absolutely take care of this with cryotherapy.  3. Olecranon bursitis of left elbow Symptoms are consistent with olecranon bursitis.  We will go ahead and treat with a Medrol Dosepak.  Discussed recommendation for heat, ice, compression, and other conservative measures.  If symptoms do not resolve after the steroids, return for further evaluation in person.  4. Weight gain Unfortunately, the Wellbutrin did not work well for her so we have taken that off of her medication list.  If interested, we can always look into other options that may be covered that could help with appetite suppression and weight loss.  I discussed the assessment and  treatment plan with the patient. The patient was provided an opportunity to ask questions and all were answered. The patient agreed with the plan and demonstrated an understanding of the instructions.   The patient was advised to call back or seek an in-person evaluation if the symptoms worsen or if the condition fails to improve as anticipated.  25 minutes of non-face-to-face time was provided during this encounter.  Return for Plantar wart removal at your convenience.  Thayer Ohm, DNP, APRN, FNP-BC [facility] and Sports Medicine

## 2021-12-16 ENCOUNTER — Other Ambulatory Visit (HOSPITAL_BASED_OUTPATIENT_CLINIC_OR_DEPARTMENT_OTHER): Payer: Self-pay | Admitting: Obstetrics & Gynecology

## 2021-12-16 DIAGNOSIS — Z1231 Encounter for screening mammogram for malignant neoplasm of breast: Secondary | ICD-10-CM

## 2021-12-17 ENCOUNTER — Ambulatory Visit (HOSPITAL_BASED_OUTPATIENT_CLINIC_OR_DEPARTMENT_OTHER)
Admission: RE | Admit: 2021-12-17 | Discharge: 2021-12-17 | Disposition: A | Discharge status: Home / Self Care | Payer: BC Managed Care – PPO | Source: Ambulatory Visit | Attending: Obstetrics & Gynecology | Admitting: Obstetrics & Gynecology

## 2021-12-17 DIAGNOSIS — Z1231 Encounter for screening mammogram for malignant neoplasm of breast: Secondary | ICD-10-CM | POA: Diagnosis present

## 2021-12-25 ENCOUNTER — Encounter (INDEPENDENT_AMBULATORY_CARE_PROVIDER_SITE_OTHER): Payer: BC Managed Care – PPO | Admitting: Medical-Surgical

## 2021-12-25 DIAGNOSIS — G43009 Migraine without aura, not intractable, without status migrainosus: Secondary | ICD-10-CM

## 2021-12-25 NOTE — Telephone Encounter (Signed)

## 2021-12-31 MED ORDER — TOPIRAMATE 25 MG PO TABS: 25.0000 mg | ORAL_TABLET | Freq: Two times a day (BID) | ORAL | 0 refills | Status: DC

## 2021-12-31 NOTE — Addendum Note (Signed)
Addended bySamuel Bouche on: 12/31/2021 12:44 PM   Modules accepted: Orders

## 2022-01-12 ENCOUNTER — Other Ambulatory Visit: Payer: Self-pay | Admitting: Sports Medicine

## 2022-01-12 DIAGNOSIS — M19041 Primary osteoarthritis, right hand: Secondary | ICD-10-CM

## 2022-01-18 NOTE — Progress Notes (Signed)
Established Patient Office Visit  Subjective   Patient ID: Gloria Rivera, female   DOB: [birth year] Age: 61 y.o. MRN: [id number]   No chief complaint on file.   HPI Pleasant 61 year old female presenting today for evaluation of a wart on her foot.  Notes that she did have a large plantar wart on the same foot in a different location that had to be excised as it got way too big.  This spot has been there for a couple of months and is very painful due to the location.  Has been using corn pads on it which do help take the pressure off and relieve the pain however she is worried about the spot getting too big and requiring excision again.  Would like to have it evaluated and treated today if possible.  Has been taking Celebrex 200 mg twice daily for her arthritic pain.  Reports the medication works very well and helps keep her symptoms stable.  She likes to use her hands to do knitting, crocheting, and other crafts.  Without the medication, she was unable to do these activities.  She has been able to resume these without difficulty since starting it.  She is requesting a refill of the medication.   Objective:    Vitals:   01/19/22 1409  BP: 107/67  Pulse: 73  Resp: 20  Height: 5\' 6"  (1.676 m)  Weight: 165 lb 6.4 oz (75 kg)  SpO2: 99%  BMI (Calculated): 26.71    Physical Exam Vitals and nursing note reviewed.  Constitutional:      General: She is not in acute distress.    Appearance: Normal appearance. She is not ill-appearing.  HENT:     Head: Normocephalic and atraumatic.  Cardiovascular:     Rate and Rhythm: Normal rate and regular rhythm.     Pulses: Normal pulses.     Heart sounds: Normal heart sounds.  Pulmonary:     Effort: Pulmonary effort is normal. No respiratory distress.     Breath sounds: Normal breath sounds. No wheezing, rhonchi or rales.  Musculoskeletal:       Feet:  Skin:    General: Skin is warm and dry.  Neurological:     Mental Status: She is alert  and oriented to person, place, and time.  Psychiatric:        Mood and Affect: Mood normal.        Behavior: Behavior normal.        Thought Content: Thought content normal.        Judgment: Judgment normal.   No results found for this or any previous visit (from the past 24 hour(s)).     The 10-year ASCVD risk score (Arnett DK, et al., 2019) is: 2.3%   Values used to calculate the score:     Age: 76 years     Sex: Female     Is Non-Hispanic African American: No     Diabetic: No     Tobacco smoker: No     Systolic Blood Pressure: 277 mmHg     Is BP treated: No     HDL Cholesterol: 51 mg/dL     Total Cholesterol: 131 mg/dL   Assessment & Plan:   1. Plantar wart of left foot Cryotherapy template Procedure: Cryodestruction of: left foot plantar wart Consent obtained and verified. Time-out conducted. Noted no overlying erythema, induration, or other signs of local infection. Completed without difficulty using Cryo-Gun. Advised to call if fevers/chills, erythema, induration,  drainage, or persistent bleeding.  2. Primary osteoarthritis of both hands Currently working well.  Refilling Celebrex 200 mg twice daily per patient request. - celecoxib (CELEBREX) 200 MG capsule; TAKE ONE TO 2 TABLETS BY MOUTH DAILY AS NEEDED FOR PAIN.  Dispense: 180 capsule; Refill: 1  Return in about 6 months (around 07/21/2022) for chronic disease follow up.  ___________________________________________ Thayer Ohm, DNP, APRN, FNP-BC Primary Care and Sports Medicine [facility]

## 2022-01-19 ENCOUNTER — Encounter: Payer: Self-pay | Admitting: Medical-Surgical

## 2022-01-19 ENCOUNTER — Ambulatory Visit: Payer: BC Managed Care – PPO | Admitting: Medical-Surgical

## 2022-01-19 VITALS — BP 107/67 | HR 73 | Resp 20 | Ht 66.0 in | Wt 165.4 lb

## 2022-01-19 DIAGNOSIS — M19042 Primary osteoarthritis, left hand: Secondary | ICD-10-CM

## 2022-01-19 DIAGNOSIS — B070 Plantar wart: Secondary | ICD-10-CM

## 2022-01-19 DIAGNOSIS — M19041 Primary osteoarthritis, right hand: Secondary | ICD-10-CM | POA: Diagnosis not present

## 2022-01-19 MED ORDER — CELECOXIB 200 MG PO CAPS: ORAL_CAPSULE | ORAL | 1 refills | Status: DC

## 2022-02-03 ENCOUNTER — Ambulatory Visit (HOSPITAL_BASED_OUTPATIENT_CLINIC_OR_DEPARTMENT_OTHER): Payer: BC Managed Care – PPO | Admitting: Obstetrics & Gynecology

## 2022-02-03 ENCOUNTER — Encounter (HOSPITAL_BASED_OUTPATIENT_CLINIC_OR_DEPARTMENT_OTHER): Payer: Self-pay

## 2022-02-15 ENCOUNTER — Other Ambulatory Visit (HOSPITAL_COMMUNITY)
Admission: RE | Admit: 2022-02-15 | Discharge: 2022-02-15 | Disposition: A | Discharge status: Home / Self Care | Payer: BC Managed Care – PPO | Source: Ambulatory Visit | Attending: Obstetrics & Gynecology | Admitting: Obstetrics & Gynecology

## 2022-02-15 ENCOUNTER — Encounter (HOSPITAL_BASED_OUTPATIENT_CLINIC_OR_DEPARTMENT_OTHER): Payer: Self-pay | Admitting: Obstetrics & Gynecology

## 2022-02-15 ENCOUNTER — Ambulatory Visit (HOSPITAL_BASED_OUTPATIENT_CLINIC_OR_DEPARTMENT_OTHER): Payer: BC Managed Care – PPO | Admitting: Obstetrics & Gynecology

## 2022-02-15 VITALS — BP 122/77 | HR 74 | Ht 66.0 in | Wt 165.2 lb

## 2022-02-15 DIAGNOSIS — N898 Other specified noninflammatory disorders of vagina: Secondary | ICD-10-CM | POA: Diagnosis present

## 2022-02-15 DIAGNOSIS — N941 Unspecified dyspareunia: Secondary | ICD-10-CM | POA: Diagnosis not present

## 2022-02-15 DIAGNOSIS — R5383 Other fatigue: Secondary | ICD-10-CM

## 2022-02-15 NOTE — Progress Notes (Signed)
GYNECOLOGY  VISIT  CC:   painful intercourse  HPI: 61 y.o. G26P3003 [name] here for complaint of a lot of fatigue.  Feels like she could nap almost any time of the day.  Also having some pain with intercourse and occasionally some spotting afterwards.  She does feel very dry and has some pain when wiping with toilet pain.  Even sitting down sometimes if painful.  She is taking her estradiol/norethindrone HRT.  She has used vaginal estradiol cream and this seemed to exacerbate bacterial vaginitis.  She really does not want to be on something regularly.  Options for treatment discussed.  She is having some vaginal discharge without odor or color.    Past Medical History:  Diagnosis Date   Adult acne    Anxiety    Arthritis    both hands   Fracture of right ankle    Seizure ([facility])    Stomach ulcer     MEDS:   Current Outpatient Medications on File Prior to Visit  Medication Sig Dispense Refill   ampicillin (PRINCIPEN) 500 MG capsule Take 500 mg by mouth daily as needed.     Calcium Carbonate (CALCIUM 600 PO) Take by mouth.     celecoxib (CELEBREX) 200 MG capsule TAKE ONE TO 2 TABLETS BY MOUTH DAILY AS NEEDED FOR PAIN. 180 capsule 1   Estradiol-Norethindrone Acet 0.5-0.1 MG tablet Take 1 tablet by mouth daily. 84 tablet 4   fluticasone (FLOVENT HFA) 44 MCG/ACT inhaler Inhale 2 puffs into the lungs in the morning and at bedtime. 1 each 12   Multiple Vitamin (MULTIVITAMIN) capsule Take 1 capsule by mouth daily.     SUMAtriptan (IMITREX) 50 MG tablet Take 2 tablets (100 mg total) by mouth every 2 (two) hours as needed for migraine. May repeat in 2 hours if headache persists or recurs. 10 tablet 5   topiramate (TOPAMAX) 25 MG tablet Take 1 tablet (25 mg total) by mouth 2 (two) times daily. Please start with 25mg  daily for 1 week and if tolerated well, increase to 2 times daily. 180 tablet 0   venlafaxine XR (EFFEXOR-XR) 150 MG 24 hr capsule Take 1 capsule (150 mg total)  by mouth daily with breakfast. 90 capsule 4   VITAMIN D PO Take by mouth.     No current facility-administered medications on file prior to visit.    ALLERGIES: Bee venom  SH:  married, non smoker  Review of Systems  Genitourinary:        Painful intercourse    PHYSICAL EXAMINATION:    BP 122/77 (BP Location: Right Arm, Patient Position: Sitting, Cuff Size: Large)   Pulse 74   Ht 5\' 6"  (1.676 m) Comment: Reported  Wt 165 lb 3.2 oz (74.9 kg)   BMI 26.66 kg/m     General appearance: alert, cooperative and appears stated age Lymph:  no inguinal LAD noted  Pelvic: External genitalia:  no lesions              Urethra:  normal appearing urethra with no masses, tenderness or lesions              Bartholins and Skenes: normal                 Vagina: normal appearing vagina with normal color and discharge, no lesions              Cervix: no lesions  Chaperone, Ina Homes, CMA, was present for exam.  Assessment/Plan: 1. Dyspareunia in female - topical and prescription products discussed.  She really does not want to be on something long term  2. Fatigue, unspecified type - Ferritin - CBC - TSH - Comprehensive metabolic panel  3. Vaginal discharge - vaginitis testing obtained to evaluate for yeast and BV

## 2022-02-16 LAB — COMPREHENSIVE METABOLIC PANEL
ALT: 19 IU/L (ref 0–32)
AST: 23 IU/L (ref 0–40)
Albumin/Globulin Ratio: 1.8 (ref 1.2–2.2)
Albumin: 4.3 g/dL (ref 3.9–4.9)
Alkaline Phosphatase: 109 IU/L (ref 44–121)
BUN/Creatinine Ratio: 13 (ref 12–28)
BUN: 9 mg/dL (ref 8–27)
Bilirubin Total: 0.3 mg/dL (ref 0.0–1.2)
CO2: 25 mmol/L (ref 20–29)
Calcium: 9.3 mg/dL (ref 8.7–10.3)
Chloride: 102 mmol/L (ref 96–106)
Creatinine, Ser: 0.68 mg/dL (ref 0.57–1.00)
Globulin, Total: 2.4 g/dL (ref 1.5–4.5)
Glucose: 71 mg/dL (ref 70–99)
Potassium: 4.1 mmol/L (ref 3.5–5.2)
Sodium: 140 mmol/L (ref 134–144)
Total Protein: 6.7 g/dL (ref 6.0–8.5)
eGFR: 98 mL/min/{1.73_m2} (ref >59)

## 2022-02-16 LAB — CERVICOVAGINAL ANCILLARY ONLY
Bacterial Vaginitis (gardnerella): POSITIVE — AB
Comment: NEGATIVE

## 2022-02-16 LAB — FERRITIN: Ferritin: 24 ng/mL (ref 15–150)

## 2022-02-16 LAB — CBC
Hematocrit: 40.3 % (ref 34.0–46.6)
Hemoglobin: 13.2 g/dL (ref 11.1–15.9)
MCH: 28.9 pg (ref 26.6–33.0)
MCHC: 32.8 g/dL (ref 31.5–35.7)
MCV: 88 fL (ref 79–97)
Platelets: 448 10*3/uL (ref 150–450)
RBC: 4.56 x10E6/uL (ref 3.77–5.28)
RDW: 12.4 % (ref 11.7–15.4)
WBC: 6.4 10*3/uL (ref 3.4–10.8)

## 2022-02-16 LAB — TSH: TSH: 1.28 u[IU]/mL (ref 0.450–4.500)

## 2022-02-17 ENCOUNTER — Other Ambulatory Visit (HOSPITAL_BASED_OUTPATIENT_CLINIC_OR_DEPARTMENT_OTHER): Payer: Self-pay | Admitting: *Deleted

## 2022-02-17 MED ORDER — METRONIDAZOLE 500 MG PO TABS: 500.0000 mg | ORAL_TABLET | Freq: Two times a day (BID) | ORAL | 0 refills | Status: DC

## 2022-03-17 ENCOUNTER — Encounter (HOSPITAL_BASED_OUTPATIENT_CLINIC_OR_DEPARTMENT_OTHER): Payer: Self-pay | Admitting: Obstetrics & Gynecology

## 2022-03-19 ENCOUNTER — Other Ambulatory Visit (HOSPITAL_BASED_OUTPATIENT_CLINIC_OR_DEPARTMENT_OTHER): Payer: Self-pay

## 2022-03-19 ENCOUNTER — Other Ambulatory Visit (HOSPITAL_COMMUNITY)
Admission: RE | Admit: 2022-03-19 | Discharge: 2022-03-19 | Disposition: A | Discharge status: Home / Self Care | Payer: BC Managed Care – PPO | Source: Ambulatory Visit | Attending: Obstetrics & Gynecology | Admitting: Obstetrics & Gynecology

## 2022-03-19 DIAGNOSIS — N898 Other specified noninflammatory disorders of vagina: Secondary | ICD-10-CM

## 2022-03-19 NOTE — Progress Notes (Signed)
Patient came in with complaints of BV. Patient states she is having vaginal discharge. Patient gave a self aptima swab to be evaluated. tbw

## 2022-03-24 ENCOUNTER — Other Ambulatory Visit: Payer: Self-pay | Admitting: Medical-Surgical

## 2022-03-24 LAB — CERVICOVAGINAL ANCILLARY ONLY
Bacterial Vaginitis (gardnerella): POSITIVE — AB
Candida Glabrata: NEGATIVE
Candida Vaginitis: NEGATIVE
Comment: NEGATIVE
Comment: NEGATIVE
Comment: NEGATIVE

## 2022-05-13 ENCOUNTER — Encounter (HOSPITAL_BASED_OUTPATIENT_CLINIC_OR_DEPARTMENT_OTHER): Payer: Self-pay | Admitting: Obstetrics & Gynecology

## 2022-05-17 ENCOUNTER — Encounter: Payer: Self-pay | Admitting: *Deleted

## 2022-05-18 NOTE — Telephone Encounter (Signed)
Called pt in response to myChart message to set up appt for her. Pt provided with appt for evaluation of symptoms.

## 2022-05-24 ENCOUNTER — Ambulatory Visit (HOSPITAL_BASED_OUTPATIENT_CLINIC_OR_DEPARTMENT_OTHER): Payer: BC Managed Care – PPO | Admitting: Obstetrics & Gynecology

## 2022-05-24 ENCOUNTER — Other Ambulatory Visit (HOSPITAL_COMMUNITY)
Admission: RE | Admit: 2022-05-24 | Discharge: 2022-05-24 | Disposition: A | Discharge status: Home / Self Care | Payer: BC Managed Care – PPO | Source: Ambulatory Visit | Attending: Obstetrics & Gynecology | Admitting: Obstetrics & Gynecology

## 2022-05-24 ENCOUNTER — Encounter (HOSPITAL_BASED_OUTPATIENT_CLINIC_OR_DEPARTMENT_OTHER): Payer: Self-pay | Admitting: Obstetrics & Gynecology

## 2022-05-24 VITALS — BP 121/92 | HR 73 | Ht 66.0 in | Wt 152.2 lb

## 2022-05-24 DIAGNOSIS — Z9229 Personal history of other drug therapy: Secondary | ICD-10-CM | POA: Diagnosis not present

## 2022-05-24 DIAGNOSIS — Z79890 Hormone replacement therapy (postmenopausal): Secondary | ICD-10-CM

## 2022-05-24 DIAGNOSIS — N760 Acute vaginitis: Secondary | ICD-10-CM

## 2022-05-24 MED ORDER — METRONIDAZOLE 0.75 % VA GEL: 1.0000 | Freq: Every day | VAGINAL | 0 refills | Status: DC

## 2022-05-24 MED ORDER — METRONIDAZOLE 500 MG PO TABS: 500.0000 mg | ORAL_TABLET | Freq: Two times a day (BID) | ORAL | 0 refills | Status: DC

## 2022-05-24 MED ORDER — FLUCONAZOLE 150 MG PO TABS: ORAL_TABLET | ORAL | 0 refills | Status: DC

## 2022-05-24 MED ORDER — ESTRADIOL-NORETHINDRONE ACET 0.5-0.1 MG PO TABS: ORAL_TABLET | ORAL | 4 refills | Status: DC

## 2022-05-24 NOTE — Progress Notes (Signed)
GYNECOLOGY  VISIT  CC:   recurrent vaginitis  HPI: 62 y.o. G25P3003 [name] here for history of recurrent vaginitis.  Testing almost always showed recurrent BV.  She has more recently used flagyl '500mg'$  bid x 7 days and then used boric acid.  She reports this actually burned.  Then she felt she had yeast and used monistat x 2 since December.  Now, she is just feeling raw and irritated.  Discharge is green.  She has gotten on a forum and saw a treatment with two weeks of flagyl and diflucan and metrogel.  At this point, we have done the evidence based treatments for recurrent BV.  Has been taking 1/2 tab of her HRT.  No hot flashes.  Has also stopped her amoxicillin as acne has gotten a lot better on lower HRT dosgae.  This was about 2 months ago.   Past Medical History:  Diagnosis Date   Adult acne    Anxiety    Arthritis    both hands   Fracture of right ankle    Seizure ([facility])    Stomach ulcer     MEDS:   Current Outpatient Medications on File Prior to Visit  Medication Sig Dispense Refill   Calcium Carbonate (CALCIUM 600 PO) Take by mouth.     celecoxib (CELEBREX) 200 MG capsule TAKE ONE TO 2 TABLETS BY MOUTH DAILY AS NEEDED FOR PAIN. 180 capsule 1   Multiple Vitamin (MULTIVITAMIN) capsule Take 1 capsule by mouth daily.     SUMAtriptan (IMITREX) 50 MG tablet Take 2 tablets (100 mg total) by mouth every 2 (two) hours as needed for migraine. May repeat in 2 hours if headache persists or recurs. 10 tablet 5   topiramate (TOPAMAX) 25 MG tablet Take 1 tablet (25 mg total) by mouth 2 (two) times daily. Please start with '25mg'$  daily for 1 week and if tolerated well, increase to 2 times daily. 180 tablet 0   VITAMIN D PO Take by mouth.     No current facility-administered medications on file prior to visit.    ALLERGIES: Bee venom  SH:  married, non smoker  Review of Systems  Genitourinary:        Vaginal discharge    PHYSICAL EXAMINATION:    BP (!)  121/92 (BP Location: Right Arm, Patient Position: Sitting, Cuff Size: Large)   Pulse 73   Ht '5\' 6"'$  (1.676 m) Comment: Reported  Wt 152 lb 3.2 oz (69 kg)   BMI 24.57 kg/m     General appearance: alert, cooperative and appears stated age Lymph:  no inguinal LAD noted  Pelvic: External genitalia:  no lesions              Urethra:  normal appearing urethra with no masses, tenderness or lesions              Bartholins and Skenes: normal                 Vagina: erythema, discharge              Cervix: no lesions              Bimanual Exam:  Uterus:  normal size, contour, position, consistency, mobility, non-tender              Adnexa: no mass, fullness, tenderness  Chaperone, Tonya, CMA, was present for exam.  Assessment/Plan: 1. Recurrent vaginitis - WOUND CULTURE - Cervicovaginal ancillary only( [place]) -  metroNIDAZOLE (FLAGYL) 500 MG tablet; Take 1 tablet (500 mg total) by mouth 2 (two) times daily.  Dispense: 28 tablet; Refill: 0 - metroNIDAZOLE (METROGEL) 0.75 % vaginal gel; Place 1 Applicatorful vaginally at bedtime. Use for 10 nights.  Dispense: 70 g; Refill: 0 - fluconazole (DIFLUCAN) 150 MG tablet; 1 tablet every 3 days for 5 doses  Dispense: 5 tablet; Refill: 0  2.  Hormone replacement therapy (HRT) - Estradiol-Norethindrone Acet 0.5-0.1 MG tablet; 1/2 tab daily  Dispense: 84 tablet; Refill: 4

## 2022-05-25 LAB — CERVICOVAGINAL ANCILLARY ONLY
Bacterial Vaginitis (gardnerella): POSITIVE — AB
Candida Glabrata: NEGATIVE
Candida Vaginitis: NEGATIVE
Comment: NEGATIVE
Comment: NEGATIVE
Comment: NEGATIVE

## 2022-05-27 LAB — WOUND CULTURE

## 2022-05-28 ENCOUNTER — Encounter: Payer: Self-pay | Admitting: Medical-Surgical

## 2022-06-02 ENCOUNTER — Encounter (HOSPITAL_BASED_OUTPATIENT_CLINIC_OR_DEPARTMENT_OTHER): Payer: Self-pay | Admitting: Obstetrics & Gynecology

## 2022-06-04 ENCOUNTER — Other Ambulatory Visit (HOSPITAL_BASED_OUTPATIENT_CLINIC_OR_DEPARTMENT_OTHER): Payer: Self-pay | Admitting: *Deleted

## 2022-06-04 MED ORDER — FLUCONAZOLE 150 MG PO TABS: 150.0000 mg | ORAL_TABLET | Freq: Once | ORAL | 0 refills | Status: AC

## 2022-06-06 ENCOUNTER — Other Ambulatory Visit (HOSPITAL_BASED_OUTPATIENT_CLINIC_OR_DEPARTMENT_OTHER): Payer: Self-pay | Admitting: Obstetrics & Gynecology

## 2022-06-06 DIAGNOSIS — N760 Acute vaginitis: Secondary | ICD-10-CM

## 2022-06-16 ENCOUNTER — Other Ambulatory Visit (HOSPITAL_BASED_OUTPATIENT_CLINIC_OR_DEPARTMENT_OTHER): Payer: Self-pay | Admitting: Obstetrics & Gynecology

## 2022-06-16 DIAGNOSIS — B9689 Other specified bacterial agents as the cause of diseases classified elsewhere: Secondary | ICD-10-CM

## 2022-06-16 MED ORDER — CLINDAMYCIN PHOSPHATE 2 % VA CREA: 1.0000 | TOPICAL_CREAM | Freq: Every day | VAGINAL | 0 refills | Status: DC

## 2022-06-18 ENCOUNTER — Other Ambulatory Visit (HOSPITAL_BASED_OUTPATIENT_CLINIC_OR_DEPARTMENT_OTHER): Payer: Self-pay | Admitting: Obstetrics & Gynecology

## 2022-06-18 DIAGNOSIS — N760 Acute vaginitis: Secondary | ICD-10-CM

## 2022-06-18 MED ORDER — METRONIDAZOLE 0.75 % VA GEL: VAGINAL | 0 refills | Status: DC

## 2022-06-18 MED ORDER — TERCONAZOLE 0.4 % VA CREA: 1.0000 | TOPICAL_CREAM | Freq: Every day | VAGINAL | 1 refills | Status: DC

## 2022-06-18 MED ORDER — METRONIDAZOLE 500 MG PO TABS: ORAL_TABLET | ORAL | 1 refills | Status: DC

## 2022-06-29 ENCOUNTER — Other Ambulatory Visit: Payer: Self-pay | Admitting: Medical-Surgical

## 2022-07-19 ENCOUNTER — Ambulatory Visit: Payer: BC Managed Care – PPO | Admitting: Medical-Surgical

## 2022-07-19 ENCOUNTER — Encounter: Payer: Self-pay | Admitting: Medical-Surgical

## 2022-07-19 VITALS — BP 121/76 | HR 75 | Resp 20 | Ht 66.0 in | Wt 145.3 lb

## 2022-07-19 DIAGNOSIS — R300 Dysuria: Secondary | ICD-10-CM | POA: Diagnosis not present

## 2022-07-19 DIAGNOSIS — N3001 Acute cystitis with hematuria: Secondary | ICD-10-CM | POA: Diagnosis not present

## 2022-07-19 DIAGNOSIS — G43909 Migraine, unspecified, not intractable, without status migrainosus: Secondary | ICD-10-CM | POA: Insufficient documentation

## 2022-07-19 LAB — POCT URINALYSIS DIP (CLINITEK)
Bilirubin, UA: NEGATIVE
Glucose, UA: NEGATIVE mg/dL
Ketones, POC UA: NEGATIVE mg/dL
Nitrite, UA: POSITIVE — AB
POC PROTEIN,UA: 30 — AB
Spec Grav, UA: 1.01 (ref 1.010–1.025)
Urobilinogen, UA: 0.2 E.U./dL
pH, UA: 7 (ref 5.0–8.0)

## 2022-07-19 MED ORDER — FLUCONAZOLE 150 MG PO TABS: 150.0000 mg | ORAL_TABLET | Freq: Once | ORAL | 0 refills | Status: AC

## 2022-07-19 MED ORDER — SULFAMETHOXAZOLE-TRIMETHOPRIM 800-160 MG PO TABS: 1.0000 | ORAL_TABLET | Freq: Two times a day (BID) | ORAL | 0 refills | Status: DC

## 2022-07-19 NOTE — Progress Notes (Signed)
        Established patient visit  History, exam, impression, and plan:  1. Dysuria 2. Acute cystitis with hematuria Very pleasant 62 year old female presenting today with reports of urinary discomfort that started just before she went out of the country on a cruise.  She was seen in urgent care to evaluate for urinary tract infection symptoms where they prescribed Macrobid 100 mg twice daily x 5 days.  She completed 3 days of the medication with no improvement in symptoms.  Since that she was planning to go out of the country, she called them back and they changed her over to ciprofloxacin twice daily for 7 days.  She took the medication as prescribed along with Pyridium to help with burning.  She completed the Cipro course however her symptoms persisted with no improvement.  Today, she reports continued issues with hematuria, frequency, urgency, burning, suprapubic pain.  Denies fever, chills, nausea, vomiting, and flank pain.  POCT urinalysis positive for nitrites, small leukocytes, small protein, and small blood.  Sending for culture.  On exam, afebrile, HRR, S1/S2 normal.  No peripheral edema or CVA tenderness.  Mild suprapubic tenderness.  Unable to see records of prior urine specimen or culture.  Plan to treat with Bactrim DS twice daily x 3 days.  Okay to continue Azo if desired.  Recommend pushing fluids.  Continue BV prophylactic therapy as prescribed by OB/GYN.  Sending Diflucan for VVC prophylaxis. - POCT URINALYSIS DIP (CLIN[facility]K) - Urine Culture  Procedures performed this visit: None.  Return if symptoms worsen or fail to improve.  __________________________________ Thayer Ohm, DNP, APRN, FNP-BC Primary Care and Sports Medicine [facility]

## 2022-07-21 ENCOUNTER — Encounter: Payer: Self-pay | Admitting: Medical-Surgical

## 2022-07-21 LAB — URINE CULTURE
MICRO NUMBER:: 14856084
SPECIMEN QUALITY:: ADEQUATE

## 2022-07-21 MED ORDER — FOSFOMYCIN TROMETHAMINE 3 G PO PACK: 3.0000 g | PACK | Freq: Once | ORAL | 0 refills | Status: AC

## 2022-08-02 ENCOUNTER — Ambulatory Visit (HOSPITAL_BASED_OUTPATIENT_CLINIC_OR_DEPARTMENT_OTHER): Payer: BC Managed Care – PPO | Admitting: Obstetrics & Gynecology

## 2022-08-02 ENCOUNTER — Other Ambulatory Visit (HOSPITAL_BASED_OUTPATIENT_CLINIC_OR_DEPARTMENT_OTHER): Payer: Self-pay | Admitting: Obstetrics & Gynecology

## 2022-08-08 ENCOUNTER — Other Ambulatory Visit: Payer: Self-pay | Admitting: Medical-Surgical

## 2022-08-08 DIAGNOSIS — M19041 Primary osteoarthritis, right hand: Secondary | ICD-10-CM

## 2022-08-09 ENCOUNTER — Ambulatory Visit (INDEPENDENT_AMBULATORY_CARE_PROVIDER_SITE_OTHER): Payer: BC Managed Care – PPO | Admitting: Obstetrics & Gynecology

## 2022-08-09 ENCOUNTER — Encounter (HOSPITAL_BASED_OUTPATIENT_CLINIC_OR_DEPARTMENT_OTHER): Payer: Self-pay | Admitting: Obstetrics & Gynecology

## 2022-08-09 VITALS — BP 105/70 | HR 70 | Ht 66.0 in | Wt 146.6 lb

## 2022-08-09 DIAGNOSIS — Z1231 Encounter for screening mammogram for malignant neoplasm of breast: Secondary | ICD-10-CM

## 2022-08-09 DIAGNOSIS — Z01419 Encounter for gynecological examination (general) (routine) without abnormal findings: Secondary | ICD-10-CM | POA: Diagnosis not present

## 2022-08-09 DIAGNOSIS — N3090 Cystitis, unspecified without hematuria: Secondary | ICD-10-CM

## 2022-08-09 DIAGNOSIS — Z13820 Encounter for screening for osteoporosis: Secondary | ICD-10-CM

## 2022-08-09 DIAGNOSIS — F419 Anxiety disorder, unspecified: Secondary | ICD-10-CM | POA: Diagnosis not present

## 2022-08-09 DIAGNOSIS — M8588 Other specified disorders of bone density and structure, other site: Secondary | ICD-10-CM

## 2022-08-09 DIAGNOSIS — Z79890 Hormone replacement therapy (postmenopausal): Secondary | ICD-10-CM

## 2022-08-09 DIAGNOSIS — Z87440 Personal history of urinary (tract) infections: Secondary | ICD-10-CM

## 2022-08-09 DIAGNOSIS — L299 Pruritus, unspecified: Secondary | ICD-10-CM

## 2022-08-09 DIAGNOSIS — N760 Acute vaginitis: Secondary | ICD-10-CM

## 2022-08-09 LAB — POCT URINALYSIS DIPSTICK
Bilirubin, UA: NEGATIVE
Glucose, UA: NEGATIVE
Ketones, UA: NEGATIVE
Nitrite, UA: NEGATIVE
Protein, UA: NEGATIVE
Spec Grav, UA: 1.015 (ref 1.010–1.025)
Urobilinogen, UA: 0.2 E.U./dL
pH, UA: 7 (ref 5.0–8.0)

## 2022-08-09 MED ORDER — HYDROXYZINE HCL 10 MG PO TABS
10.0000 mg | ORAL_TABLET | Freq: Every day | ORAL | 0 refills | Status: DC
Start: 2022-08-09 — End: 2022-08-10

## 2022-08-09 MED ORDER — VENLAFAXINE HCL ER 37.5 MG PO CP24
37.5000 mg | ORAL_CAPSULE | Freq: Every day | ORAL | 3 refills | Status: DC
Start: 2022-08-09 — End: 2022-12-06

## 2022-08-09 MED ORDER — VENLAFAXINE HCL ER 150 MG PO CP24: 150.0000 mg | ORAL_CAPSULE | Freq: Every day | ORAL | 3 refills | Status: DC

## 2022-08-09 NOTE — Progress Notes (Unsigned)
62 y.o. G41P3003 [name] here for annual exam.  Denies vaginal bleeding.  No vaginal irritation. Using metrogel twice weekly for suppressive therapy.  Had trip this spring to [place] and then on cruise.  Had UTI symptoms.  Was initially prescribed macrobid.  Was outside for about 20 minutes after starting medication and reports having very severe, 2nd degree burn.  Skin became very red with significant peeling.  Was miserable trip.    Discussed with pt that sun sensitivity was macrobid is not typical.  Question if had mild Levonne Spiller syndrome or exfoliative dermatitis (which have both been seen after initial safety trial).  Still having some erythema and skin peeling.  Highly recommended follow up with Dr. Karlyn Agee, dermatology.  Will add macrobid as allergy for now.    Unfortunately, this antibiotic didn't clear the UTI so was also given cipro, bactrim and then fosfomycin.  She took 2 diflucan as well with this.  Urine culture 4/22 with enterococcus.  Is feeling better but mild dysuria.  Will repeat culture today.   She is aware I am unsure about tdap and she is aware to get with any exposure.  On HRT.  Has cut back to 1/2 tab daily.  Does not need rx right now.    With all these stressors, feeling like she needs adjustment in vanlafaxine.  Has been on 150mg  dosage for several years.  Max dosing discussed.  She would like to increase a little bit.  No LMP recorded. Patient is postmenopausal.          Sexually active: Yes.    The current method of family planning is post menopausal status.    Exercising: No.   Smoker:  no  Health Maintenance: Pap:  07/24/2020 Negative History of abnormal Pap:  no MMG:  12/17/2021 Negative Colonoscopy:  cologuard 08/16/2021, follow up 3 years BMD:   11/17/2020 osteopenia Screening Labs: does with PCP   reports that she has never smoked. She has never been exposed to tobacco smoke. She has never used smokeless tobacco. She  reports current alcohol use of about 2.0 standard drinks of alcohol per week. She reports that she does not use drugs.  Past Medical History:  Diagnosis Date   Adult acne    Anxiety    Arthritis    both hands   Fracture of right ankle    Seizure ([facility])    Stomach ulcer     Past Surgical History:  Procedure Laterality Date   ABLATION  11/2006   ANKLE FRACTURE SURGERY Right 12/2006   BREAST LUMPECTOMY  1997   Benign   CATARACT EXTRACTION Right 10/2020   with lens replacement   JOINT REPLACEMENT     KNEE ARTHROSCOPY Left 1995   Osteoma Removal  12/2012   TONSILLECTOMY     VITRECTOMY  03/12/2021   done at [facility]    Current Outpatient Medications  Medication Sig Dispense Refill   Calcium Carbonate (CALCIUM 600 PO) Take by mouth.     clindamycin (CLEOCIN) 2 % vaginal cream Place 1 Applicatorful vaginally at bedtime. One applicator full qhs x 7 days 40 g 0   Estradiol-Norethindrone Acet 0.5-0.1 MG tablet 1/2 tab daily 84 tablet 4   metroNIDAZOLE (METROGEL) 0.75 % vaginal gel Place 1 applicator vaginally twice weekly 70 g 0   SUMAtriptan (IMITREX) 50 MG tablet Take 2 tablets (100 mg total) by mouth every 2 (two) hours as needed for migraine. May repeat in 2 hours if  headache persists or recurs. 10 tablet 5   terconazole (TERAZOL 7) 0.4 % vaginal cream Place 1 applicator vaginally at bedtime. Use for 7 nights. 45 g 1   topiramate (TOPAMAX) 25 MG tablet TAKE 1 TABLET (25 MG TOTAL) BY MOUTH 2 (TWO) TIMES DAILY. PLEASE START WITH 25MG  DAILY FOR 1 WEEK AND IF TOLERATED WELL, INCREASE TO 2 TIMES DAILY. 180 tablet 0   venlafaxine XR (EFFEXOR-XR) 150 MG 24 hr capsule TAKE 1 CAPSULE BY MOUTH DAILY WITH BREAKFAST. 90 capsule 2   VITAMIN D PO Take by mouth.     celecoxib (CELEBREX) 200 MG capsule TAKE ONE TO 2 TABLETS BY MOUTH DAILY AS NEEDED FOR PAIN. 180 capsule 1   No current facility-administered medications for this visit.    Family History  Problem Relation Age of Onset   COPD Father     Breast cancer Maternal Grandmother    Heart attack Maternal Uncle     ROS: Constitutional: negative Genitourinary: some twinges at the end of emptying her bladder  Exam:   BP 105/70 (BP Location: Left Arm, Patient Position: Sitting, Cuff Size: Large)   Pulse 70   Ht 5\' 6"  (1.676 m) Comment: Reported  Wt 146 lb 9.6 oz (66.5 kg)   BMI 23.66 kg/m   Height: 5\' 6"  (167.6 cm) (Reported)  General appearance: alert, cooperative and appears stated age Head: Normocephalic, without obvious abnormality, atraumatic Neck: no adenopathy, supple, symmetrical, trachea midline and thyroid normal to inspection and palpation Lungs: clear to auscultation bilaterally Breasts: normal appearance, no masses or tenderness Heart: regular rate and rhythm Abdomen: soft, non-tender; bowel sounds normal; no masses,  no organomegaly Extremities: extremities normal, atraumatic, no cyanosis or edema Skin: Skin color, texture, turgor normal. No rashes or lesions Lymph nodes: Cervical, supraclavicular, and axillary nodes normal. No abnormal inguinal nodes palpated Neurologic: Grossly normal   Pelvic: External genitalia:  no lesions              Urethra:  normal appearing urethra with no masses, tenderness or lesions              Bartholins and Skenes: normal                 Vagina: normal appearing vagina with normal color and no discharge, no lesions              Cervix: no lesions              Pap taken: No. Bimanual Exam:  Uterus:  normal size, contour, position, consistency, mobility, non-tender              Adnexa: normal adnexa and no mass, fullness, tenderness               Rectovaginal: Confirms               Anus:  normal sphincter tone, no lesions  Chaperone, Ina Homes, CMA, was present for exam.  Assessment/Plan: 1. Well woman exam with routine gynecological exam - Pap smear neg 07/24/2020.  Not obtained today. - Mammogram 12/17/2021 - Colonoscopy declined but negative cologuard  08/16/2021 - Bone mineral density 11/17/2020 with osteopenia - lab work done with PCP, Christen Butter - vaccines reviewed/updated  2. Hormone replacement therapy (HRT) - on 1/2 tab activella (generic) half strength.  Will call when needs RF.  3. Encounter for screening mammogram for malignant neoplasm of breast - MM 3D SCREENING MAMMOGRAM BILATERAL BREAST; Future  4. History of UTI - urine  culture pending from today - POCT Urinalysis Dipstick  5. Itching - encouraged pt to follow up with Dr. Yetta Barre - hydrOXYzine (ATARAX) 10 MG tablet; Take 1 tablet (10 mg total) by mouth at bedtime.  Dispense: 30 tablet; Refill: 0  6. Osteopenia of lumbar spine - DG BONE DENSITY (DXA); Future  7. Anxiety - will increase medicaiton by 37.5mg  daily. - venlafaxine XR (EFFEXOR-XR) 150 MG 24 hr capsule; Take 1 capsule (150 mg total) by mouth daily with breakfast.  Dispense: 90 capsule; Refill: 3 - venlafaxine XR (EFFEXOR XR) 37.5 MG 24 hr capsule; Take 1 capsule (37.5 mg total) by mouth daily.  Dispense: 90 capsule; Refill: 3  8.  Recurrent vaginitis - pt currently using twice weekly metrogel

## 2022-08-10 ENCOUNTER — Other Ambulatory Visit (HOSPITAL_BASED_OUTPATIENT_CLINIC_OR_DEPARTMENT_OTHER): Payer: Self-pay | Admitting: Obstetrics & Gynecology

## 2022-08-10 DIAGNOSIS — L299 Pruritus, unspecified: Secondary | ICD-10-CM

## 2022-08-10 DIAGNOSIS — M8588 Other specified disorders of bone density and structure, other site: Secondary | ICD-10-CM | POA: Insufficient documentation

## 2022-08-10 DIAGNOSIS — N760 Acute vaginitis: Secondary | ICD-10-CM | POA: Insufficient documentation

## 2022-08-10 DIAGNOSIS — Z79890 Hormone replacement therapy (postmenopausal): Secondary | ICD-10-CM | POA: Insufficient documentation

## 2022-08-13 ENCOUNTER — Other Ambulatory Visit (HOSPITAL_BASED_OUTPATIENT_CLINIC_OR_DEPARTMENT_OTHER): Payer: Self-pay | Admitting: Obstetrics & Gynecology

## 2022-08-13 DIAGNOSIS — N3000 Acute cystitis without hematuria: Secondary | ICD-10-CM

## 2022-08-13 LAB — URINE CULTURE

## 2022-08-13 MED ORDER — AMOXICILLIN 875 MG PO TABS: 875.0000 mg | ORAL_TABLET | Freq: Two times a day (BID) | ORAL | 0 refills | Status: DC

## 2022-08-20 ENCOUNTER — Other Ambulatory Visit: Payer: Self-pay | Admitting: Obstetrics & Gynecology

## 2022-08-20 ENCOUNTER — Other Ambulatory Visit (HOSPITAL_BASED_OUTPATIENT_CLINIC_OR_DEPARTMENT_OTHER): Payer: BC Managed Care – PPO

## 2022-08-20 DIAGNOSIS — N3000 Acute cystitis without hematuria: Secondary | ICD-10-CM

## 2022-08-23 LAB — URINE CULTURE

## 2022-08-26 ENCOUNTER — Encounter (HOSPITAL_BASED_OUTPATIENT_CLINIC_OR_DEPARTMENT_OTHER): Payer: Self-pay | Admitting: Obstetrics & Gynecology

## 2022-08-27 ENCOUNTER — Other Ambulatory Visit (HOSPITAL_BASED_OUTPATIENT_CLINIC_OR_DEPARTMENT_OTHER): Payer: BC Managed Care – PPO

## 2022-08-27 ENCOUNTER — Other Ambulatory Visit (HOSPITAL_BASED_OUTPATIENT_CLINIC_OR_DEPARTMENT_OTHER): Payer: Self-pay | Admitting: Obstetrics & Gynecology

## 2022-08-27 DIAGNOSIS — R3989 Other symptoms and signs involving the genitourinary system: Secondary | ICD-10-CM

## 2022-08-27 MED ORDER — URIBEL 118 MG PO CAPS
1.0000 | ORAL_CAPSULE | Freq: Four times a day (QID) | ORAL | 0 refills | Status: DC
Start: 2022-08-27 — End: 2022-09-20

## 2022-08-31 ENCOUNTER — Other Ambulatory Visit: Payer: Self-pay | Admitting: Medical-Surgical

## 2022-09-03 ENCOUNTER — Other Ambulatory Visit (HOSPITAL_BASED_OUTPATIENT_CLINIC_OR_DEPARTMENT_OTHER): Payer: Self-pay | Admitting: Obstetrics & Gynecology

## 2022-09-03 MED ORDER — AMOXICILLIN 875 MG PO TABS: 875.0000 mg | ORAL_TABLET | Freq: Two times a day (BID) | ORAL | 0 refills | Status: DC

## 2022-09-05 ENCOUNTER — Other Ambulatory Visit (HOSPITAL_BASED_OUTPATIENT_CLINIC_OR_DEPARTMENT_OTHER): Payer: Self-pay | Admitting: Obstetrics & Gynecology

## 2022-09-05 DIAGNOSIS — Z79890 Hormone replacement therapy (postmenopausal): Secondary | ICD-10-CM

## 2022-09-05 DIAGNOSIS — Z9229 Personal history of other drug therapy: Secondary | ICD-10-CM

## 2022-09-20 ENCOUNTER — Ambulatory Visit: Payer: BC Managed Care – PPO | Admitting: Internal Medicine

## 2022-09-20 ENCOUNTER — Encounter: Payer: Self-pay | Admitting: Internal Medicine

## 2022-09-20 VITALS — BP 100/74 | HR 79 | Temp 97.4°F | Ht 66.0 in | Wt 145.0 lb

## 2022-09-20 DIAGNOSIS — N3090 Cystitis, unspecified without hematuria: Secondary | ICD-10-CM | POA: Insufficient documentation

## 2022-09-20 LAB — POC URINALSYSI DIPSTICK (AUTOMATED)
Bilirubin, UA: NEGATIVE
Glucose, UA: NEGATIVE
Ketones, UA: NEGATIVE
Nitrite, UA: NEGATIVE
Protein, UA: POSITIVE — AB
Spec Grav, UA: 1.02 (ref 1.010–1.025)
Urobilinogen, UA: 0.2 E.U./dL
pH, UA: 6 (ref 5.0–8.0)

## 2022-09-20 MED ORDER — PHENAZOPYRIDINE HCL 100 MG PO TABS: 100.0000 mg | ORAL_TABLET | Freq: Three times a day (TID) | ORAL | 0 refills | Status: DC | PRN

## 2022-09-20 MED ORDER — AMOXICILLIN 500 MG PO TABS
500.0000 mg | ORAL_TABLET | Freq: Two times a day (BID) | ORAL | 1 refills | Status: DC
Start: 2022-09-20 — End: 2022-09-24

## 2022-09-20 MED ORDER — FLUCONAZOLE 150 MG PO TABS
150.0000 mg | ORAL_TABLET | ORAL | 1 refills | Status: DC
Start: 2022-09-20 — End: 2022-12-06

## 2022-09-20 NOTE — Addendum Note (Signed)
Addended by: Eual Fines on: 09/20/2022 04:03 PM   Modules accepted: Orders

## 2022-09-20 NOTE — Telephone Encounter (Signed)
Called pt in response to [employer]. Advised that if she is having new urinary symptoms, she should come in for urine culture. Pt reports that she has new patient appt with primary care today and will have them test her urine there. Advised that if they do the urine culture, they will need to be the treating provider as well. Pt verbalized understanding. Advised pt that if she has appt with urogynecology, she should not need a referral.

## 2022-09-20 NOTE — Progress Notes (Signed)
Subjective:    Patient ID: Gloria Rivera, female    DOB: 12-17-1960, 62 y.o.   MRN: [id number]  HPI Here for recurrent urinary symptoms  Has had recurrent urinary symptoms for over 3 months (started early April) Has seen gyn for this Referred to urogynecologist next month  2 days ago--noticed pain at the end of voiding Sense of full bladder and urgency Felt warm yesterday at church--like fever  Took azo 2 days ago--this helped  BV off and on for 2 years Now on metrogel twice a week as preventative (effective) Has abstained from sex--based on gyn --but not happy about this  Current Outpatient Medications on File Prior to Visit  Medication Sig Dispense Refill   Calcium Carbonate (CALCIUM 600 PO) Take by mouth.     celecoxib (CELEBREX) 200 MG capsule TAKE ONE TO 2 TABLETS BY MOUTH DAILY AS NEEDED FOR PAIN. 180 capsule 1   Estradiol-Norethindrone Acet 0.5-0.1 MG tablet 1/2 tab daily 84 tablet 4   hydrOXYzine (ATARAX) 10 MG tablet TAKE 1 TABLET BY MOUTH EVERYDAY AT BEDTIME 90 tablet 1   metroNIDAZOLE (METROGEL) 0.75 % vaginal gel Place 1 applicator vaginally twice weekly 70 g 0   SUMAtriptan (IMITREX) 50 MG tablet TAKE 2 TABLETS (100 MG TOTAL) BY MOUTH EVERY 2 (TWO) HOURS AS NEEDED FOR MIGRAINE. MAY REPEAT IN 2 HOURS IF HEADACHE PERSISTS OR RECURS. 30 tablet 2   topiramate (TOPAMAX) 25 MG tablet TAKE 1 TABLET (25 MG TOTAL) BY MOUTH 2 (TWO) TIMES DAILY. PLEASE START WITH 25MG  DAILY FOR 1 WEEK AND IF TOLERATED WELL, INCREASE TO 2 TIMES DAILY. 180 tablet 0   venlafaxine XR (EFFEXOR XR) 37.5 MG 24 hr capsule Take 1 capsule (37.5 mg total) by mouth daily. 90 capsule 3   venlafaxine XR (EFFEXOR-XR) 150 MG 24 hr capsule Take 1 capsule (150 mg total) by mouth daily with breakfast. 90 capsule 3   VITAMIN D PO Take by mouth.     No current facility-administered medications on file prior to visit.    Allergies  Allergen Reactions   Bee Venom     Past Medical History:  Diagnosis Date    Adult acne    Anxiety    Arthritis    both hands   Fracture of right ankle    Seizure ([facility])    Stomach ulcer     Past Surgical History:  Procedure Laterality Date   ABLATION  11/2006   ANKLE FRACTURE SURGERY Right 12/2006   BREAST LUMPECTOMY  1997   Benign   CATARACT EXTRACTION Right 10/2020   with lens replacement   JOINT REPLACEMENT     KNEE ARTHROSCOPY Left 1995   Osteoma Removal  12/2012   TONSILLECTOMY     VITRECTOMY  03/12/2021   done at [facility]    Family History  Problem Relation Age of Onset   COPD Father    Breast cancer Maternal Grandmother    Heart attack Maternal Uncle     Social History   Socioeconomic History   Marital status: Married    Spouse name: Not on file   Number of children: 3   Years of education: Not on file   Highest education level: Not on file  Occupational History   Occupation: Retired  Tobacco Use   Smoking status: Never    Passive exposure: Never   Smokeless tobacco: Never  Vaping Use   Vaping Use: Never used  Substance and Sexual Activity   Alcohol use: Yes  Alcohol/week: 2.0 standard drinks of alcohol    Types: 2 Glasses of wine per week    Comment: beer/wine/liquor twice a month   Drug use: Never   Sexual activity: Yes    Partners: Male    Birth control/protection: Post-menopausal    Comment: ablation  Other Topics Concern   Not on file  Social History Narrative   No exercise. 2 sodas of diet pepsi a day    Social Determinants of Health   Financial Resource Strain: Not on file  Food Insecurity: Not on file  Transportation Needs: Not on file  Physical Activity: Not on file  Stress: Not on file  Social Connections: Not on file  Intimate Partner Violence: Not on file   Review of Systems No N/V No abdominal pain No back pain    Objective:   Physical Exam Constitutional:      Appearance: Normal appearance.  Abdominal:     Palpations: Abdomen is soft.     Tenderness: There is no abdominal tenderness.   Neurological:     Mental Status: She is alert.            Assessment & Plan:

## 2022-09-20 NOTE — Assessment & Plan Note (Addendum)
Urinalysis shows 3+ blood (but not gross), 1+ leuks, negative nitrite 2 cultures with Enterococcus Has recurred after 5 day courses  Will treat with amoxil 500 bid x 10 days Pyridium helped her Fluconazole for common secondary yeast infection May need preventative antibiotic for a while--will leave up to urogyn ?interstitial cystitis

## 2022-09-21 ENCOUNTER — Encounter: Payer: Self-pay | Admitting: Internal Medicine

## 2022-09-21 ENCOUNTER — Other Ambulatory Visit: Payer: Self-pay | Admitting: Medical-Surgical

## 2022-09-21 LAB — URINE CULTURE
MICRO NUMBER:: 15118979
SPECIMEN QUALITY:: ADEQUATE

## 2022-09-22 ENCOUNTER — Emergency Department
Admission: EM | Admit: 2022-09-22 | Discharge: 2022-09-22 | Disposition: A | Discharge status: Home / Self Care | Payer: BC Managed Care – PPO | Attending: Emergency Medicine | Admitting: Emergency Medicine

## 2022-09-22 ENCOUNTER — Other Ambulatory Visit: Payer: Self-pay

## 2022-09-22 ENCOUNTER — Encounter: Payer: Self-pay | Admitting: Emergency Medicine

## 2022-09-22 DIAGNOSIS — N390 Urinary tract infection, site not specified: Secondary | ICD-10-CM | POA: Insufficient documentation

## 2022-09-22 DIAGNOSIS — R350 Frequency of micturition: Secondary | ICD-10-CM | POA: Diagnosis present

## 2022-09-22 DIAGNOSIS — R300 Dysuria: Secondary | ICD-10-CM

## 2022-09-22 LAB — URINALYSIS, ROUTINE W REFLEX MICROSCOPIC
Bacteria, UA: NONE SEEN
Bilirubin Urine: NEGATIVE
Glucose, UA: NEGATIVE mg/dL
Ketones, ur: NEGATIVE mg/dL
Leukocytes,Ua: NEGATIVE
Nitrite: POSITIVE — AB
Protein, ur: >=300 mg/dL — AB
RBC / HPF: >50 RBC/hpf (ref 0–5)
Specific Gravity, Urine: 1.017 (ref 1.005–1.030)
WBC, UA: >50 WBC/hpf (ref 0–5)
pH: 6 (ref 5.0–8.0)

## 2022-09-22 LAB — BASIC METABOLIC PANEL
Anion gap: 7 (ref 5–15)
BUN: 16 mg/dL (ref 8–23)
CO2: 26 mmol/L (ref 22–32)
Calcium: 8.7 mg/dL — ABNORMAL LOW (ref 8.9–10.3)
Chloride: 104 mmol/L (ref 98–111)
Creatinine, Ser: 0.8 mg/dL (ref 0.44–1.00)
GFR, Estimated: >60 mL/min (ref >60)
Glucose, Bld: 104 mg/dL — ABNORMAL HIGH (ref 70–99)
Potassium: 3.9 mmol/L (ref 3.5–5.1)
Sodium: 137 mmol/L (ref 135–145)

## 2022-09-22 LAB — CBC
HCT: 39.5 % (ref 36.0–46.0)
Hemoglobin: 12.4 g/dL (ref 12.0–15.0)
MCH: 28.8 pg (ref 26.0–34.0)
MCHC: 31.4 g/dL (ref 30.0–36.0)
MCV: 91.9 fL (ref 80.0–100.0)
Platelets: 438 10*3/uL — ABNORMAL HIGH (ref 150–400)
RBC: 4.3 MIL/uL (ref 3.87–5.11)
RDW: 12.9 % (ref 11.5–15.5)
WBC: 7.2 10*3/uL (ref 4.0–10.5)
nRBC: 0 % (ref 0.0–0.2)

## 2022-09-22 MED ORDER — CEFTRIAXONE SODIUM 1 G IJ SOLR
1.0000 g | Freq: Once | INTRAMUSCULAR | Status: AC
  Administered 2022-09-22: 1 g via INTRAMUSCULAR
  Filled 2022-09-22: qty 10

## 2022-09-22 MED ORDER — SULFAMETHOXAZOLE-TRIMETHOPRIM 800-160 MG PO TABS: 1.0000 | ORAL_TABLET | Freq: Two times a day (BID) | ORAL | 0 refills | Status: DC

## 2022-09-22 MED ORDER — KETOROLAC TROMETHAMINE 30 MG/ML IJ SOLN
30.0000 mg | Freq: Once | INTRAMUSCULAR | Status: AC
  Administered 2022-09-22: 30 mg via INTRAMUSCULAR
  Filled 2022-09-22: qty 1

## 2022-09-22 MED ORDER — DIAZEPAM 5 MG PO TABS
5.0000 mg | ORAL_TABLET | Freq: Once | ORAL | Status: AC
  Administered 2022-09-22: 5 mg via ORAL
  Filled 2022-09-22: qty 1

## 2022-09-22 MED ORDER — LIDOCAINE HCL (PF) 1 % IJ SOLN
1.0000 mL | Freq: Once | INTRAMUSCULAR | Status: DC
  Filled 2022-09-22: qty 5

## 2022-09-22 NOTE — ED Notes (Signed)
Patient states has had UTI off and on since April. Patient states it flared up again Sat. No blood noted in urine. Rates pain a 10.

## 2022-09-22 NOTE — Discharge Instructions (Addendum)
Take Bactrim for the full course as prescribed and call your regular doctor for follow-up appointment to review the results of your urine culture and adjustments to these medications as needed.  Stop taking your amoxicillin.  Follow-up with the urogynecologist next week as scheduled.  Take acetaminophen 650 mg and ibuprofen 400 mg every 6 hours for pain.  Take with food.

## 2022-09-22 NOTE — ED Notes (Signed)
Pt reports she needs a few mins for the valium to work before administering IM medication, informed nurse

## 2022-09-22 NOTE — ED Provider Notes (Signed)
[facility] Provider Note    Event Date/Time   First MD Initiated Contact with Patient 09/22/22 1259     (approximate)   History   Urinary Frequency   HPI  Gloria Rivera is a 62 y.o. female   Past medical history of recurrent UTIs, most recent illness consisting of dysuria and frequency over the last several days prescribed amoxicillin but getting worse with 2 days of antibiotic treatment.  Multiple treatment courses in the past with urinalysis and urine culture showing Enterococcus faecalis.    She denies systemic symptoms like fevers or chills.  Independent Historian contributed to assessment above: Her husband is at bedside corroborates information given above     Physical Exam   Triage Vital Signs: ED Triage Vitals [09/22/22 1144]  Enc Vitals Group     BP 120/85     Pulse Rate 86     Resp 18     Temp 98.8 F (37.1 C)     Temp Source Oral     SpO2 100 %     Weight      Height      Head Circumference      Peak Flow      Pain Score 10     Pain Loc      Pain Edu?      Excl. in GC?     Most recent vital signs: Vitals:   09/22/22 1144  BP: 120/85  Pulse: 86  Resp: 18  Temp: 98.8 F (37.1 C)  SpO2: 100%    General: Awake, no distress.  CV:  Good peripheral perfusion.  Resp:  Normal effort.  Abd:  No distention.  Other:  Awake alert comfortable with normal vital signs afebrile soft nontender abdomen   ED Results / Procedures / Treatments   Labs (all labs ordered are listed, but only abnormal results are displayed) Labs Reviewed  URINALYSIS, ROUTINE W REFLEX MICROSCOPIC - Abnormal; Notable for the following components:      Result Value   Color, Urine AMBER (*)    APPearance CLEAR (*)    Hgb urine dipstick SMALL (*)    Protein, ur >=300 (*)    Nitrite POSITIVE (*)    All other components within normal limits  BASIC METABOLIC PANEL - Abnormal; Notable for the following components:   Glucose, Bld 104 (*)    Calcium  8.7 (*)    All other components within normal limits  CBC - Abnormal; Notable for the following components:   Platelets 438 (*)    All other components within normal limits  URINE CULTURE     I ordered and reviewed the above labs they are notable for nitrite positive urine with greater than 50 white blood cells, white blood cell count normal  PROCEDURES:  Critical Care performed: No  Procedures   MEDICATIONS ORDERED IN ED: Medications  lidocaine (PF) (XYLOCAINE) 1 % injection 1-2.1 mL (has no administration in time range)  diazepam (VALIUM) tablet 5 mg (5 mg Oral Given 09/22/22 1354)  ketorolac (TORADOL) 30 MG/ML injection 30 mg (30 mg Intramuscular Given 09/22/22 1457)  cefTRIAXone (ROCEPHIN) injection 1 g (1 g Intramuscular Given 09/22/22 1450)    IMPRESSION / MDM / ASSESSMENT AND PLAN / ED COURSE  I reviewed the triage vital signs and the nursing notes.  Patient's presentation is most consistent with acute presentation with potential threat to life or bodily function.  Differential diagnosis includes, but is not limited to, urinary tract infection, sepsis, pyelonephriti    MDM: Patient with recurrent urinary tract infections that has been refractory and worsening despite amoxicillin.  Based on her prior sensitivities, will give Bactrim antibiotic, daughter who is an NP on the phone suggests IM Toradol and IM Rocephin which I ordered for the patient while in the ED.  She will follow-up with her primary doctor in addition to her urogynecologist whom she is meeting next week to establish care.  She appears nontoxic and has no signs of sepsis, I think outpatient management appropriate at this time.      FINAL CLINICAL IMPRESSION(S) / ED DIAGNOSES   Final diagnoses:  Lower urinary tract infectious disease  Dysuria  Urinary frequency     Rx / DC Orders   ED Discharge Orders          Ordered    sulfamethoxazole-trimethoprim (BACTRIM  DS) 800-160 MG tablet  2 times daily        09/22/22 1358             Note:  This document was prepared using Dragon voice recognition software and may include unintentional dictation errors.    Pilar Jarvis, MD 09/22/22 [phone]

## 2022-09-22 NOTE — ED Triage Notes (Addendum)
Patient to ED via POV for urinary frequency with burning. Patient states he has had symptoms since April 9 and is on her 7th antibiotic for same. Symptoms worsening per patient.  Patient states she took husbands hydrocodone this AM for pain relief but did not help.

## 2022-09-23 ENCOUNTER — Encounter (HOSPITAL_BASED_OUTPATIENT_CLINIC_OR_DEPARTMENT_OTHER): Payer: Self-pay | Admitting: Obstetrics & Gynecology

## 2022-09-23 LAB — URINE CULTURE: Culture: NO GROWTH

## 2022-09-23 NOTE — Telephone Encounter (Signed)
TC to pt. Pt needs to be seen by Urogyn.  Urine culture from yesterday's visit at Ed was negative.  Pt received Rocephin and Bactrim and prior to take tried Amoxicillin without relief - Bacteria could be resistant to these meds.  Pt still in pain.  Some vaginal symptoms and patient states urethrea feels stabbing pain after urinating.  Pt has ice on abdomen to help with discomfort.  Apt available at Urogyn Friday am scheduled that apt with Kaitlin 6/28 at 8am.  Pt agree to be there 745. KW CMA

## 2022-09-23 NOTE — Progress Notes (Signed)
[facility] New Patient Evaluation and Consultation  Referring Provider: Christen Butter, NP PCP: Christen Butter, NP Date of Service: 09/24/2022  SUBJECTIVE Chief Complaint: New Patient (Initial Visit) Gloria Rivera is a 62 y.o. female here for a consult for recurrent UTI's.)  History of Present Illness: Gloria Rivera is a 62 y.o. White or Caucasian female seen in consultation at the request of Dr. Larinda Buttery for evaluation of rUTI Symptoms/ bladder pain syndrome.    Review of records significant for: Urine Cultures:  09/22/22: No Growth 09/20/22: Less than 10,000 Growth 08/20/22: Mixed Flora 08/10/22: +Enterococcus >100,000 07/19/22:+ Enterococcus 10-49k 06/09/21: No Growth   Has hx of recurrent BV which she seed Dr. Hyacinth Meeker for and they have her on a regimen of Metrogel x2 weekly.    Urinary Symptoms: Does not leak urine.    Day time voids 4.  Nocturia: 4 times per night to void. Voiding dysfunction: she does not empty her bladder well.  does not use a catheter to empty bladder.  When urinating, she feels a weak stream, difficulty starting urine stream, dribbling after finishing, and the need to urinate multiple times in a row Drinks: 2 Cokes per WJX,91-47WG  Water   UTIs: 6 UTI's in the last year.   Reports history of blood in urine  Pelvic Organ Prolapse Symptoms:                  She Denies a feeling of a bulge the vaginal area.   Bowel Symptom: Bowel movements: 3-4 time(s) per week Stool consistency: soft  Straining: no.  Splinting: no.  Incomplete evacuation: no.  She Denies accidental bowel leakage / fecal incontinence Bowel regimen: none Last colonoscopy: Never, Cologuard May 2023  Sexual Function Sexually active: yes.  Sexual orientation: Straight Pain with sex: No  Pelvic Pain Denies pelvic pain    Past Medical History:  Past Medical History:  Diagnosis Date   Adult acne    Anxiety    Arthritis    both hands   Fracture of right ankle     Seizure (HCC)    Stomach ulcer      Past Surgical History:   Past Surgical History:  Procedure Laterality Date   ABLATION  11/2006   ANKLE FRACTURE SURGERY Right 12/2006   BREAST LUMPECTOMY  1997   Benign   CATARACT EXTRACTION Right 10/2020   with lens replacement   JOINT REPLACEMENT     KNEE ARTHROSCOPY Left 1995   Osteoma Removal  12/2012   TONSILLECTOMY     VITRECTOMY  03/12/2021   done at [facility]     Past OB/GYN History: G3 P3 Vaginal deliveries: 3,  Forceps/ Vacuum deliveries: 0, Cesarean section: 0 Menopausal: Yes, at age 20 Last pap smear was 06/2020.  Any history of abnormal pap smears: no.   Medications: She has a current medication list which includes the following prescription(s): calcium carbonate, celecoxib, d-mannose, estradiol-norethindrone acet, fluconazole, fosfomycin, hydroxyzine, metronidazole, probiotic & acidophilus ex st, sumatriptan, topiramate, triamcinolone cream, venlafaxine xr, venlafaxine xr, vitamin d, and phenazopyridine.   Allergies: Patient is allergic to bee venom.   Social History:  Social History   Tobacco Use   Smoking status: Never    Passive exposure: Never   Smokeless tobacco: Never  Vaping Use   Vaping Use: Never used  Substance Use Topics   Alcohol use: Yes    Alcohol/week: 2.0 standard drinks of alcohol    Types: 2 Glasses of wine per week  Comment: beer/wine/liquor twice a month   Drug use: Never    Relationship status: married She lives with Husband Virgil.   She is not employed. Regular exercise: No History of abuse: No  Family History:   Family History  Problem Relation Age of Onset   COPD Father    Breast cancer Maternal Grandmother    Heart attack Maternal Uncle      Review of Systems: Review of Systems  Constitutional:  Negative for fever, malaise/fatigue and weight loss.  Respiratory:  Negative for cough, shortness of breath and wheezing.   Cardiovascular:  Negative for chest pain, palpitations and  leg swelling.  Gastrointestinal:  Negative for abdominal pain, blood in stool and constipation.  Genitourinary:  Positive for dysuria.  Musculoskeletal:  Negative for myalgias.  Neurological:  Negative for dizziness, weakness and headaches.  Endo/Heme/Allergies:  Bruises/bleeds easily.  Psychiatric/Behavioral:  Negative for depression and suicidal ideas. The patient is nervous/anxious.      OBJECTIVE Physical Exam: Vitals:   09/24/22 0804  BP: 120/80  Pulse: 89  Weight: 144 lb (65.3 kg)  Height: 5' 5.25" (1.657 m)    Physical Exam Constitutional:      Appearance: Normal appearance.  Pulmonary:     Effort: Pulmonary effort is normal.  Abdominal:     General: Abdomen is flat.     Palpations: Abdomen is soft.  Genitourinary:    General: Normal vulva.  Skin:    General: Skin is warm and dry.  Neurological:     Mental Status: She is alert and oriented to person, place, and time.  Psychiatric:        Mood and Affect: Mood normal.        Behavior: Behavior normal.        Thought Content: Thought content normal.        Judgment: Judgment normal.      GU / Detailed Urogynecologic Evaluation:  Pelvic Exam: Normal external female genitalia; Bartholin's and Skene's glands normal in appearance; urethral meatus normal in appearance, no urethral masses or discharge.   CST: negative  Speculum exam reveals normal vaginal mucosa with atrophy. Cervix normal appearance. Uterus normal single, nontender. Adnexa normal adnexa.    With apex supported, anterior compartment defect was reduced  Pelvic floor strength I/V  Pelvic floor musculature: Right levator tender, Right obturator tender, Left levator non-tender, Left obturator non-tender  POP-Q:   POP-Q  -2                                            Aa   -2                                           Ba  -6                                              C   2                                            Gh  3.5                                             Pb  9                                            tvl   -2                                            Ap  -2                                            Bp  -8                                              D      Rectal Exam:  Normal   Post-Void Residual (PVR) by Bladder Scan: In order to evaluate bladder emptying, we discussed obtaining a postvoid residual and she agreed to this procedure.  Procedure: The ultrasound unit was placed on the patient's abdomen in the suprapubic region after the patient had voided. A PVR of 0 ml was obtained by bladder scan.  Laboratory Results: POC Urine: Orange color, cloudy appearance, Positive for blood, protein, nitrites and small leukocytes.  Of note, she has been taking pyridium.   ASSESSMENT AND PLAN Ms. Stangler is a 62 y.o. with:  1. Recurrent UTI   2. Urinary frequency   3. Leukocytes in urine   4. Hematuria, unspecified type    Patient has had 2 positive cultures in the last 3 months with a few of her cultures being negative, but she has also been treated for UTI without cultures so unsure of true number of UTI. As this is a new problem for the patient, we discussed treating it as a rUTI and then re-evaluating and if she is still having symptoms we will need to address related to possible IC.  Will start Fosfomyacin 3g every 10 days for antibiotic prophylaxis. Encouraged her to do OTC D-Mannose  500mg  daily and a probiotic for UTI prevention. We also discussed the use of estrogen cream, which she has but has not been using. Encouraged her to use this every night for 2 weeks around the urethra and after that to use it twice weekly for UTI prevention.  Urine sent for culture today.  Urine sent for Micro today.   Patient to follow up in 3 months or sooner for re-check on symptoms.    Selmer Dominion, NP

## 2022-09-24 ENCOUNTER — Other Ambulatory Visit (HOSPITAL_COMMUNITY)
Admission: RE | Admit: 2022-09-24 | Discharge: 2022-09-24 | Disposition: A | Discharge status: Home / Self Care | Payer: BC Managed Care – PPO | Source: Other Acute Inpatient Hospital | Attending: Obstetrics and Gynecology | Admitting: Obstetrics and Gynecology

## 2022-09-24 ENCOUNTER — Encounter: Payer: Self-pay | Admitting: Obstetrics and Gynecology

## 2022-09-24 ENCOUNTER — Ambulatory Visit: Payer: BC Managed Care – PPO | Admitting: Obstetrics and Gynecology

## 2022-09-24 VITALS — BP 120/80 | HR 89 | Ht 65.25 in | Wt 144.0 lb

## 2022-09-24 DIAGNOSIS — N390 Urinary tract infection, site not specified: Secondary | ICD-10-CM

## 2022-09-24 DIAGNOSIS — R82998 Other abnormal findings in urine: Secondary | ICD-10-CM | POA: Insufficient documentation

## 2022-09-24 DIAGNOSIS — R350 Frequency of micturition: Secondary | ICD-10-CM

## 2022-09-24 DIAGNOSIS — R319 Hematuria, unspecified: Secondary | ICD-10-CM | POA: Diagnosis not present

## 2022-09-24 LAB — POCT URINALYSIS DIPSTICK
Bilirubin, UA: NEGATIVE
Glucose, UA: NEGATIVE
Ketones, UA: NEGATIVE
Nitrite, UA: POSITIVE
Protein, UA: POSITIVE — AB
Spec Grav, UA: >=1.03 — AB (ref 1.010–1.025)
Urobilinogen, UA: 0.2 E.U./dL
pH, UA: 6.5 (ref 5.0–8.0)

## 2022-09-24 LAB — URINALYSIS, ROUTINE W REFLEX MICROSCOPIC
Glucose, UA: NEGATIVE mg/dL
Ketones, ur: NEGATIVE mg/dL
Nitrite: POSITIVE — AB
Protein, ur: >=300 mg/dL — AB
RBC / HPF: >50 RBC/hpf (ref 0–5)
Specific Gravity, Urine: 1.017 (ref 1.005–1.030)
WBC, UA: >50 WBC/hpf (ref 0–5)
pH: 6 (ref 5.0–8.0)

## 2022-09-24 MED ORDER — D-MANNOSE 500 MG PO CAPS: 1.0000 | ORAL_CAPSULE | Freq: Every day | ORAL | 5 refills | Status: DC

## 2022-09-24 MED ORDER — PROBIOTIC & ACIDOPHILUS EX ST PO CAPS: 1.0000 | ORAL_CAPSULE | Freq: Every day | ORAL | 5 refills | Status: DC

## 2022-09-24 MED ORDER — FOSFOMYCIN TROMETHAMINE 3 G PO PACK
3.0000 g | PACK | ORAL | 5 refills | Status: DC
Start: 2022-09-24 — End: 2022-09-28

## 2022-09-24 NOTE — Patient Instructions (Addendum)
Today we talked about ways to manage bladder urgency such as altering your diet to avoid irritative beverages and foods (bladder diet) as well as attempting to decrease stress and other exacerbating factors.  You can also chew a plain Tums 1-3 times per day to make your urine less acidic, especially if you have eating/drinking acidic things.   There is a website with helpful information for people with bladder irritation, called the IC Network at https://www.ic-network.com. This website has more information about a healthy bladder diet and patient forums for support.  The Most Bothersome Foods* The Least Bothersome Foods*  Coffee - Regular & Decaf Tea - caffeinated Carbonated beverages - cola, non-colas, diet & caffeine-free Alcohols - Beer, Red Wine, White Wine, [address] - Grapefruit, [place], Orange, [employer] - Cranberry, Grapefruit, Orange, Pineapple Vegetables - Tomato & Tomato Products Flavor Enhancers - Hot peppers, Spicy foods, Chili, Horseradish, Vinegar, Monosodium glutamate (MSG) Artificial Sweeteners - NutraSweet, Sweet 'N Low, Equal (sweetener), Saccharin Ethnic foods - [place], [place], [place] food [employer] - low-fat & whole Fruits - Bananas, Blueberries, Honeydew melon, Pears, Raisins, Watermelon Vegetables - Broccoli, [address] Sprouts, [place], Carrots, Cauliflower, [place], Cucumber, Mushrooms, Peas, Radishes, Squash, Zucchini, White potatoes, Sweet potatoes & yams Poultry - Chicken, Eggs, [place], [employer] - Beef, Diplomatic Services operational officer, Lamb Seafood - Shrimp, [place] fish, Salmon Grains - Oat, Rice Snacks - Pretzels, Popcorn  *Lenward Chancellor et al. Diet and its role in interstitial cystitis/bladder pain syndrome (IC/BPS) and comorbid conditions. [employer]. BJU Int. 2012 Jan 11.    Start D-Mannose 500mg  daily and Probiotic  Start Fosfomycin 3g packet every 10 days  Suggest a probiotic for urinary health-lactobacillius (Culturelle Women's 4 in 1 is my suggestion)  Do  not sip fluids, intentionally drink your fluids  Use estrogen cream every night for 2 weeks and then

## 2022-09-25 ENCOUNTER — Encounter: Payer: Self-pay | Admitting: Obstetrics and Gynecology

## 2022-09-25 LAB — URINE CULTURE: Culture: <10000 — AB

## 2022-09-26 ENCOUNTER — Telehealth: Payer: Self-pay | Admitting: Obstetrics and Gynecology

## 2022-09-26 NOTE — Telephone Encounter (Signed)
See TC information. Recommend to keep appointment 09/29/22 Suggest topical Lidocaine for immediate relief  Strongly encouraged to start vaginal E2 as recommended.  Clinical history c/w GSM

## 2022-09-28 ENCOUNTER — Encounter: Payer: Self-pay | Admitting: Obstetrics and Gynecology

## 2022-09-28 ENCOUNTER — Ambulatory Visit: Payer: BC Managed Care – PPO | Admitting: Obstetrics and Gynecology

## 2022-09-28 VITALS — BP 105/71 | HR 96

## 2022-09-28 DIAGNOSIS — R3989 Other symptoms and signs involving the genitourinary system: Secondary | ICD-10-CM

## 2022-09-28 MED ORDER — LIDOCAINE HCL URETHRAL/MUCOSAL 2 % EX GEL
1.0000 | Freq: Once | CUTANEOUS | Status: AC
Start: 2022-09-28 — End: 2022-09-28
  Administered 2022-09-28: 1 via URETHRAL

## 2022-09-28 MED ORDER — BUPIVACAINE HCL 0.25 % IJ SOLN
20.0000 mL | Freq: Once | INTRAMUSCULAR | Status: AC
Start: 2022-09-28 — End: 2022-09-28
  Administered 2022-09-28: 20 mL

## 2022-09-28 MED ORDER — HEPARIN SODIUM (PORCINE) 10000 UNIT/ML IJ SOLN
10000.0000 [IU] | Freq: Once | INTRAMUSCULAR | Status: AC
Start: 2022-09-28 — End: 2022-09-28
  Administered 2022-09-28: 10000 [IU] via INTRAVESICAL

## 2022-09-28 MED ORDER — SODIUM BICARBONATE 8.4 % IV SOLN
5.0000 mL | Freq: Once | INTRAVENOUS | Status: AC
Start: 2022-09-28 — End: 2022-09-28
  Administered 2022-09-28: 5 mL

## 2022-09-28 MED ORDER — LIDOCAINE HCL 2 % IJ SOLN
20.0000 mL | Freq: Once | INTRAMUSCULAR | Status: AC
Start: 2022-09-28 — End: 2022-09-28
  Administered 2022-09-28: 400 mg

## 2022-09-28 NOTE — Progress Notes (Signed)
[facility] Return Visit  SUBJECTIVE  History of Present Illness: Gloria Rivera is a 62 y.o. female seen in follow-up for Bladder pain. Plan at last visit was treat as recurrent UTI and start on rUTI prophylaxis. Over the weekend, patient had significant bladder pain and called the on-call provider. She was instructed to use topical lidocaine and follow up regarding her bladder pain.    She has cut down on her soda to 6-12oz per day.   Patient reports she has taken Valium 5mg  prior to this installation today.   Past Medical History: Patient  has a past medical history of Adult acne, Anxiety, Arthritis, Fracture of right ankle, Seizure (HCC), and Stomach ulcer.   Past Surgical History: She  has a past surgical history that includes Joint replacement; Knee arthroscopy (Left, 1995); Ankle fracture surgery (Right, 12/2006); Ablation (11/2006); Breast lumpectomy (1997); Osteoma Removal (12/2012); Tonsillectomy; Cataract extraction (Right, 10/2020); and Vitrectomy (03/12/2021).   Medications: She has a current medication list which includes the following prescription(s): calcium carbonate, celecoxib, d-mannose, estradiol-norethindrone acet, fluconazole, fosfomycin, hydroxyzine, metronidazole, phenazopyridine, probiotic & acidophilus ex st, sumatriptan, topiramate, triamcinolone cream, venlafaxine xr, venlafaxine xr, and vitamin d.   Allergies: Patient is allergic to bee venom.   Social History: Patient  reports that she has never smoked. She has never been exposed to tobacco smoke. She has never used smokeless tobacco. She reports current alcohol use of about 2.0 standard drinks of alcohol per week. She reports that she does not use drugs.      OBJECTIVE   Bladder Instillation: The patient was identified and verbally consented for the procedure. The urethra was prepped with Betadine x 3. A 16 Fr foley catheter was inserted and attached to a 60 mL syringe with the plunger  removed. The medication was slowly poured into the bladder via the syringe and foley. The medication consisted of 20ml of Lidocaine 2%, 20mL of Bupivicaine 0.25%, 10,000 units/mL Heparin, 5mL Sodium Bicarbonate 8.4%. The foley was removed and the patient was asked to hold the liquids in her bladder for 30-60 minutes if possible.   Precautions were given and patient was instructed to call the office or on-call number for any concerns.  Selmer Dominion, NP   Physical Exam: Vitals:   09/28/22 1240  BP: 105/71  Pulse: 96   Gen: No apparent distress, A&O x 3.  Detailed Urogynecologic Evaluation:  Deferred.    ASSESSMENT AND PLAN    [name] is a 62 y.o. with:  1. Bladder pain    Patient has had significant bladder pain. We discussed following the IC guidelines and treating this as IC instead of rUTI. She reports understanding and will not take more doses of Fosfomycin. She will also take 10mg  of Hydroxyzine at night with the goal to increase potentially up to 20mg  if she is able to tolerate. Encouraged her to start D-Mannose as well for UTI prevention. Will plan for patient to follow up for bladder installations weekly x6 weeks.    Patient to follow up in 1 week for bladder installation.

## 2022-09-28 NOTE — Patient Instructions (Signed)
Start D-mannose 250mg  daily  Start the hydroxyzine 10mg  nightly.

## 2022-10-05 ENCOUNTER — Ambulatory Visit (INDEPENDENT_AMBULATORY_CARE_PROVIDER_SITE_OTHER): Payer: BC Managed Care – PPO

## 2022-10-05 DIAGNOSIS — R3989 Other symptoms and signs involving the genitourinary system: Secondary | ICD-10-CM

## 2022-10-05 MED ORDER — SODIUM BICARBONATE 8.4 % IV SOLN
5.0000 mL | Freq: Once | INTRAVENOUS | Status: AC
Start: 2022-10-05 — End: 2022-10-05
  Administered 2022-10-05: 5 mL

## 2022-10-05 MED ORDER — BUPIVACAINE HCL 0.25 % IJ SOLN
20.0000 mL | Freq: Once | INTRAMUSCULAR | Status: AC
Start: 2022-10-05 — End: 2022-10-05
  Administered 2022-10-05: 20 mL

## 2022-10-05 MED ORDER — HEPARIN SODIUM (PORCINE) 10000 UNIT/ML IJ SOLN
10000.00 [IU] | Freq: Once | INTRAMUSCULAR | Status: AC
Start: 2022-10-05 — End: 2022-10-05
  Administered 2022-10-05: 10000 [IU] via INTRAVESICAL

## 2022-10-05 MED ORDER — LIDOCAINE HCL 2 % IJ SOLN
20.00 mL | Freq: Once | INTRAMUSCULAR | Status: AC
Start: 2022-10-05 — End: 2022-10-05
  Administered 2022-10-05: 400 mg

## 2022-10-05 NOTE — Patient Instructions (Signed)
Please keep all future appointments and if you have any questions or concerns please feel free to contact our office at [phone]. 

## 2022-10-05 NOTE — Progress Notes (Signed)
Bladder Instillation: The patient was identified and verbally consented for the procedure. The urethra was prepped with Betadine x 3 and Hurricane gel. A 16 Fr foley catheter was inserted and attached to a 60 mL syringe with the plunger removed. The medication was slowly poured into the bladder via the syringe and foley. The medication consisted of 20ml of Lidocaine 2%, 20mL of Bupivicaine 0.25%, 10,000 units/mL Heparin, 5mL Sodium Bicarbonate 8.4%. The foley was removed and the patient was asked to hold the liquids in her bladder for 30-60 minutes if possible.   Precautions were given and patient was instructed to call the office or on-call number for any concerns.  Thressa Sheller, CMA

## 2022-10-12 ENCOUNTER — Ambulatory Visit: Payer: BC Managed Care – PPO

## 2022-10-21 ENCOUNTER — Encounter: Payer: Self-pay | Admitting: Obstetrics and Gynecology

## 2022-10-21 ENCOUNTER — Ambulatory Visit (INDEPENDENT_AMBULATORY_CARE_PROVIDER_SITE_OTHER): Payer: BC Managed Care – PPO

## 2022-10-21 DIAGNOSIS — R3989 Other symptoms and signs involving the genitourinary system: Secondary | ICD-10-CM

## 2022-10-21 MED ORDER — SODIUM BICARBONATE 8.4 % IV SOLN
5.0000 mL | Freq: Once | INTRAVENOUS | Status: AC
Start: 2022-10-21 — End: 2022-10-21
  Administered 2022-10-21: 5 mL

## 2022-10-21 MED ORDER — BUPIVACAINE HCL 0.25 % IJ SOLN
20.0000 mL | Freq: Once | INTRAMUSCULAR | Status: AC
Start: 2022-10-21 — End: 2022-10-21
  Administered 2022-10-21: 20 mL

## 2022-10-21 MED ORDER — LIDOCAINE HCL 2 % IJ SOLN
20.0000 mL | Freq: Once | INTRAMUSCULAR | Status: AC
Start: 2022-10-21 — End: 2022-10-21
  Administered 2022-10-21: 400 mg

## 2022-10-21 MED ORDER — HEPARIN SODIUM (PORCINE) 10000 UNIT/ML IJ SOLN
10000.0000 [IU] | Freq: Once | INTRAMUSCULAR | Status: AC
Start: 2022-10-21 — End: 2022-10-21
  Administered 2022-10-21: 10000 [IU] via INTRAVESICAL

## 2022-10-21 NOTE — Patient Instructions (Signed)
Please keep all future appointments and if you have any questions or concerns please feel free to contact our office at [phone]. 

## 2022-10-21 NOTE — Progress Notes (Signed)
Bladder Instillation: The patient was identified and verbally consented for the procedure.  The urethra was prepped with Betadine x 3. A 16 Fr foley catheter was inserted the bladder and drained for 0 cc. The foley was then attached to a 60 mL syringe with the plunger removed.  The medication was slowly poured into the bladder via the syringe and foley.  The medication consisted of: 20ml of Lidocaine 2%, 20mL of Bupivicaine 0.25%, 10,000 units/mL Heparin, 5mL Sodium Bicarbonate 8.4%.  The foley was removed and the patient was asked to hold the liquids in her bladder for 30-60 minutes if possible.   Precautions were given and patient was instructed to call the office or on-call number for any concerns.  Thressa Sheller, CMA

## 2022-10-26 ENCOUNTER — Other Ambulatory Visit (HOSPITAL_COMMUNITY)
Admission: RE | Admit: 2022-10-26 | Discharge: 2022-10-26 | Disposition: A | Discharge status: Home / Self Care | Payer: BC Managed Care – PPO | Source: Other Acute Inpatient Hospital | Attending: Obstetrics and Gynecology | Admitting: Obstetrics and Gynecology

## 2022-10-26 ENCOUNTER — Ambulatory Visit (INDEPENDENT_AMBULATORY_CARE_PROVIDER_SITE_OTHER): Payer: BC Managed Care – PPO

## 2022-10-26 DIAGNOSIS — R350 Frequency of micturition: Secondary | ICD-10-CM

## 2022-10-26 DIAGNOSIS — R319 Hematuria, unspecified: Secondary | ICD-10-CM

## 2022-10-26 LAB — POCT URINALYSIS DIPSTICK
Bilirubin, UA: NEGATIVE
Glucose, UA: NEGATIVE
Ketones, UA: NEGATIVE
Leukocytes, UA: NEGATIVE
Nitrite, UA: NEGATIVE
Protein, UA: NEGATIVE
Spec Grav, UA: 1.02 (ref 1.010–1.025)
Urobilinogen, UA: 0.2 E.U./dL
pH, UA: 7 (ref 5.0–8.0)

## 2022-10-26 MED ORDER — PHENAZOPYRIDINE HCL 200 MG PO TABS: 200.0000 mg | ORAL_TABLET | Freq: Three times a day (TID) | ORAL | 0 refills | Status: DC | PRN

## 2022-10-26 NOTE — Progress Notes (Signed)
Gloria Rivera is a 62 y.o. female  arrived today with UTI sx.  A urine specimen was collected and POCT Urine was done. Urine was sent for culture POCT Urine was Negative

## 2022-10-26 NOTE — Patient Instructions (Addendum)
Your Urine dip that was done in office was Negative. I am sending the urine off for culture and you can take AZO over the counter for your discomfort in case your insurance doesn't cover the prescription.  We have also ordered Pyridium  for you to take while we wait for your culture results, hopefully this gives you some relief. We will contact you when the results are back between 3-5 days. If a different antibiotic is needed we will sent the order to the pharmacy and you will be notified. If you have any questions or concerns please feel free to call us at [phone]

## 2022-11-05 ENCOUNTER — Ambulatory Visit: Payer: BC Managed Care – PPO | Admitting: Obstetrics and Gynecology

## 2022-12-06 ENCOUNTER — Ambulatory Visit: Payer: BC Managed Care – PPO | Admitting: Family

## 2022-12-06 ENCOUNTER — Encounter: Payer: Self-pay | Admitting: Family

## 2022-12-06 VITALS — BP 102/70 | HR 78 | Temp 97.7°F | Ht 65.0 in | Wt 143.6 lb

## 2022-12-06 DIAGNOSIS — M792 Neuralgia and neuritis, unspecified: Secondary | ICD-10-CM | POA: Diagnosis not present

## 2022-12-06 DIAGNOSIS — M19041 Primary osteoarthritis, right hand: Secondary | ICD-10-CM

## 2022-12-06 DIAGNOSIS — Z91030 Bee allergy status: Secondary | ICD-10-CM

## 2022-12-06 DIAGNOSIS — M79604 Pain in right leg: Secondary | ICD-10-CM | POA: Insufficient documentation

## 2022-12-06 DIAGNOSIS — J4520 Mild intermittent asthma, uncomplicated: Secondary | ICD-10-CM

## 2022-12-06 DIAGNOSIS — M79605 Pain in left leg: Secondary | ICD-10-CM

## 2022-12-06 DIAGNOSIS — R5383 Other fatigue: Secondary | ICD-10-CM

## 2022-12-06 DIAGNOSIS — R7989 Other specified abnormal findings of blood chemistry: Secondary | ICD-10-CM | POA: Insufficient documentation

## 2022-12-06 DIAGNOSIS — N951 Menopausal and female climacteric states: Secondary | ICD-10-CM

## 2022-12-06 DIAGNOSIS — Z7722 Contact with and (suspected) exposure to environmental tobacco smoke (acute) (chronic): Secondary | ICD-10-CM | POA: Insufficient documentation

## 2022-12-06 DIAGNOSIS — C44329 Squamous cell carcinoma of skin of other parts of face: Secondary | ICD-10-CM

## 2022-12-06 DIAGNOSIS — R739 Hyperglycemia, unspecified: Secondary | ICD-10-CM | POA: Diagnosis not present

## 2022-12-06 DIAGNOSIS — J45909 Unspecified asthma, uncomplicated: Secondary | ICD-10-CM | POA: Insufficient documentation

## 2022-12-06 DIAGNOSIS — M8588 Other specified disorders of bone density and structure, other site: Secondary | ICD-10-CM

## 2022-12-06 DIAGNOSIS — Z79890 Hormone replacement therapy (postmenopausal): Secondary | ICD-10-CM

## 2022-12-06 DIAGNOSIS — M19042 Primary osteoarthritis, left hand: Secondary | ICD-10-CM

## 2022-12-06 DIAGNOSIS — N3010 Interstitial cystitis (chronic) without hematuria: Secondary | ICD-10-CM

## 2022-12-06 DIAGNOSIS — H259 Unspecified age-related cataract: Secondary | ICD-10-CM | POA: Insufficient documentation

## 2022-12-06 DIAGNOSIS — G43119 Migraine with aura, intractable, without status migrainosus: Secondary | ICD-10-CM

## 2022-12-06 LAB — BASIC METABOLIC PANEL
BUN: 10 mg/dL (ref 6–23)
CO2: 29 meq/L (ref 19–32)
Calcium: 8.9 mg/dL (ref 8.4–10.5)
Chloride: 104 meq/L (ref 96–112)
Creatinine, Ser: 0.7 mg/dL (ref 0.40–1.20)
GFR: 91.92 mL/min (ref >60.00)
Glucose, Bld: 85 mg/dL (ref 70–99)
Potassium: 3.7 meq/L (ref 3.5–5.1)
Sodium: 139 meq/L (ref 135–145)

## 2022-12-06 LAB — VITAMIN B12: Vitamin B-12: 227 pg/mL (ref 211–911)

## 2022-12-06 LAB — HEMOGLOBIN A1C: Hgb A1c MFr Bld: 6.1 % (ref 4.6–6.5)

## 2022-12-06 MED ORDER — ALBUTEROL SULFATE HFA 108 (90 BASE) MCG/ACT IN AERS
2.0000 | INHALATION_SPRAY | Freq: Four times a day (QID) | RESPIRATORY_TRACT | Status: AC | PRN
Start: 2022-12-06 — End: ?

## 2022-12-06 MED ORDER — SUMATRIPTAN SUCCINATE 50 MG PO TABS
100.0000 mg | ORAL_TABLET | ORAL | 2 refills | Status: DC | PRN
Start: 2022-12-06 — End: 2023-05-19

## 2022-12-06 MED ORDER — EPINEPHRINE 0.3 MG/0.3ML IJ SOAJ
0.3000 mg | INTRAMUSCULAR | 1 refills | Status: AC | PRN
Start: 2022-12-06 — End: ?

## 2022-12-06 MED ORDER — IMIQUIMOD 5 % EX CREA
TOPICAL_CREAM | CUTANEOUS | Status: DC
Start: 2022-12-06 — End: 2023-03-24

## 2022-12-06 MED ORDER — TOPIRAMATE 25 MG PO TABS
25.0000 mg | ORAL_TABLET | Freq: Two times a day (BID) | ORAL | 0 refills | Status: DC
Start: 2022-12-06 — End: 2022-12-13

## 2022-12-06 MED ORDER — CELECOXIB 200 MG PO CAPS
ORAL_CAPSULE | ORAL | 1 refills | Status: DC
Start: 2022-12-06 — End: 2023-08-30

## 2022-12-06 NOTE — Assessment & Plan Note (Signed)
Following with gynecology who is trying to wean her down from the HRT.

## 2022-12-06 NOTE — Assessment & Plan Note (Addendum)
Newer onset  Ordering u/s venous doppler to r/o PAD  Less likely DVT due to length of time of symptoms Neuropathy vs PAD  B12 ordered for tingling/numbness as well as A1c to r/o ddx b12 def vs diabetes  If negative vascular workup consider neurology workup for neuropathy

## 2022-12-06 NOTE — Progress Notes (Addendum)
New Patient Office Visit  Subjective:  Patient ID: Gloria Rivera, female    DOB: [birth year]  Age: 63 y.o. MRN: [id number]  CC:  Chief Complaint  Patient presents with  . Establish Care    HPI Gloria Rivera is here to establish care as a new patient.  Oriented to practice routines and expectations. Lived in [place] and was seeing Gloria Butter NP at [facility].   Pt is with acute concerns.  About six weeks ago started with redness bil lower extremities and will get red tingly and warm and during this period at the bottom half of her LE around her ankles and shins she will get some 'tingly' pin needle like sensations. She asked her daughter who is a neurological NP who stated might need neuropathy workup however not definite. She notices this more while she is standing, notices this daily.    chronic concerns:  Seeing urogyn for ongoing bladder pain, seeing Gloria Rivera. Treating her with IC. She was put on hydroxyzine at night the goal to go up to 20 mg once daily. Started also on D mannose. Completed three bladder installations with relief.   Elevated platelets, she states this is chronic for her. She states gynecology had reached out to hematologist in the past, and was told that due to chronicity and stability was of no concern per pt.   Menopause, on estradiol norethindrone, has recently decreased to half pill with gynecologist.   Depression, after her father died. Has recently tried to come down is now on 150 mg once daily. Hydroxyxine as needed.  Migraines with aura:  sumatriptan when needed and takes topamax 25 mg twice daily. She does get right sided eye changes when they occur. typically one every 5 weeks, when they do occur if she takes the sumatriptan right away and then 1 1/2 hour later, and usually with relief but with residual grogginess.   H/o vitrectomy, sees Gloria Rivera who is an ophthalmologist. Cataracts in left eye, watching and waiting.   Mammogram  and bone density, pt is scheduling due after 9/21.  Colonoscopy: never had but does cologuard. Aug 16, 2021.  Tetanus: overdue.   Follows with dermatology regularly. Currently uses imiquimod five days out of 7 days x four weeks.     ROS: Negative unless specifically indicated above in HPI.   Current Outpatient Medications:  .  albuterol (VENTOLIN HFA) 108 (90 Base) MCG/ACT inhaler, Inhale 2 puffs into the lungs every 6 (six) hours as needed for wheezing or shortness of breath., Disp: , Rfl:  .  Calcium Carbonate (CALCIUM 600 PO), Take 1 tablet by mouth daily., Disp: , Rfl:  .  D-Mannose 500 MG CAPS, Take 1 Dose by mouth daily., Disp: 90 capsule, Rfl: 5 .  EPINEPHrine 0.3 mg/0.3 mL IJ SOAJ injection, Inject 0.3 mg into the muscle as needed for anaphylaxis., Disp: 1 each, Rfl: 1 .  imiquimod (ALDARA) 5 % cream, Apply topically 5 days a week, Disp: , Rfl:  .  venlafaxine XR (EFFEXOR-XR) 150 MG 24 hr capsule, Take 1 capsule (150 mg total) by mouth daily with breakfast., Disp: 90 capsule, Rfl: 3 .  VITAMIN D PO, Take 1 capsule by mouth daily., Disp: , Rfl:  .  celecoxib (CELEBREX) 200 MG capsule, TAKE ONE TO 2 TABLETS BY MOUTH DAILY AS NEEDED FOR PAIN., Disp: 180 capsule, Rfl: 1 .  Estradiol-Norethindrone Acet 0.5-0.1 MG tablet, 1/2 tab daily, Disp: 45 tablet, Rfl: 2 .  SUMAtriptan (IMITREX) 50  MG tablet, Take 2 tablets (100 mg total) by mouth every 2 (two) hours as needed for migraine. May repeat in 2 hours if headache persists or recurs., Disp: 30 tablet, Rfl: 2 .  topiramate (TOPAMAX) 25 MG tablet, Take 1 tablet (25 mg total) by mouth 2 (two) times daily., Disp: 180 tablet, Rfl: 1 Past Medical History:  Diagnosis Date  . Adult acne   . Anxiety   . Arthritis    both hands  . Fracture of right ankle   . Seizure (HCC)   . Stomach ulcer    Past Surgical History:  Procedure Laterality Date  . ABLATION  11/2006  . ANKLE FRACTURE SURGERY Right 12/2006   with rods in place  . BREAST  LUMPECTOMY  1997   Benign  . CATARACT EXTRACTION Right 10/2020   with lens replacement  . JOINT REPLACEMENT    . KNEE ARTHROSCOPY Left 1995  . Osteoma Removal  12/2012   forehead x 3  . TONSILLECTOMY    . VITRECTOMY Right 03/12/2021   done at [facility]    Objective:   Today's Vitals: BP 102/70 (BP Location: Right Arm, Patient Position: Sitting, Cuff Size: Normal)   Pulse 78   Temp 97.7 F (36.5 C) (Temporal)   Ht 5\' 5"  (1.651 m)   Wt 143 lb 9.6 oz (65.1 kg)   SpO2 99%   BMI 23.90 kg/m   Physical Exam Constitutional:      General: She is not in acute distress.    Appearance: Normal appearance. She is normal weight. She is not ill-appearing, toxic-appearing or diaphoretic.  HENT:     Head: Normocephalic.  Cardiovascular:     Rate and Rhythm: Normal rate and regular rhythm.  Pulmonary:     Effort: Pulmonary effort is normal.     Breath sounds: Normal breath sounds.  Musculoskeletal:        General: Normal range of motion.     Right lower leg: No edema.     Left lower leg: No edema.  Neurological:     General: No focal deficit present.     Mental Status: She is alert and oriented to person, place, and time. Mental status is at baseline.  Psychiatric:        Mood and Affect: Mood normal.        Behavior: Behavior normal.        Thought Content: Thought content normal.        Judgment: Judgment normal.    Assessment & Plan:  Elevated platelet count Assessment & Plan: Will continue to monitor.   Orders: -     Pathologist smear review -     CBC with Differential/Platelet  Low calcium levels Assessment & Plan: Continue with calcium and vitamin d Supplementation.    Orders: -     Basic metabolic panel  Interstitial cystitis Assessment & Plan: Improving. Continue d mannose and f/u with urogyn as needed.     Allergy to honey bee venom -     EPINEPHrine; Inject 0.3 mg into the muscle as needed for anaphylaxis.  Dispense: 1 each; Refill: 1  Primary  osteoarthritis of both hands -     Celecoxib; TAKE ONE TO 2 TABLETS BY MOUTH DAILY AS NEEDED FOR PAIN.  Dispense: 180 capsule; Refill: 1  Hormone replacement therapy (HRT) Assessment & Plan: Titrating down with gynecology    Intractable migraine with aura without status migrainosus -     SUMAtriptan Succinate; Take 2 tablets (100 mg  total) by mouth every 2 (two) hours as needed for migraine. May repeat in 2 hours if headache persists or recurs.  Dispense: 30 tablet; Refill: 2  Senile cataract of left eye, unspecified age-related cataract type  History of second hand smoke exposure  Mild intermittent reactive airway disease without complication -     Albuterol Sulfate HFA; Inhale 2 puffs into the lungs every 6 (six) hours as needed for wheezing or shortness of breath.  Squamous cell cancer of skin of right cheek -     Imiquimod; Apply topically 5 days a week  Menopausal symptom Assessment & Plan: Following with gynecology who is trying to wean her down from the HRT.     Osteopenia of lumbar spine Assessment & Plan: Repeat dexa ordered pt making appt to schedule.  Order in per GYN.  Taking daily calcium and vitamin D.    Pain in both lower extremities Assessment & Plan: Newer onset  Ordering u/s venous doppler to r/o PAD  Less likely DVT due to length of time of symptoms Neuropathy vs PAD  B12 ordered for tingling/numbness as well as A1c to r/o ddx b12 def vs diabetes  If negative vascular workup consider neurology workup for neuropathy   Orders: -     Vitamin B12  Neuralgia of lower extremity -     Vitamin B12  Other fatigue  Hyperglycemia -     Hemoglobin A1c    Follow-up: Return in about 1 month (around 01/05/2023) for follow lower leg redness.   Mort Sawyers, FNP

## 2022-12-06 NOTE — Patient Instructions (Addendum)
  Order placed for venous duplex ultrasound for your lower legs.  If you do get a call to schedule in the next 1-2 weeks please let me know.  If negative will consider referral to neurology.  B12 ordered today as well.    Regards,   Mort Sawyers FNP-C

## 2022-12-06 NOTE — Assessment & Plan Note (Signed)
Will continue to monitor.

## 2022-12-06 NOTE — Assessment & Plan Note (Signed)
Titrating down with gynecology

## 2022-12-06 NOTE — Assessment & Plan Note (Signed)
Improving. Continue d mannose and f/u with urogyn as needed.

## 2022-12-06 NOTE — Assessment & Plan Note (Signed)
Repeat dexa ordered pt making appt to schedule.  Order in per GYN.  Taking daily calcium and vitamin D.

## 2022-12-06 NOTE — Assessment & Plan Note (Signed)
Continue with calcium and vitamin d Supplementation.

## 2022-12-07 LAB — CBC WITH DIFFERENTIAL/PLATELET
Absolute Monocytes: 562 {cells}/uL (ref 200–950)
Basophils Absolute: 62 {cells}/uL (ref 0–200)
Basophils Relative: 1.3 %
Eosinophils Absolute: 158 {cells}/uL (ref 15–500)
Eosinophils Relative: 3.3 %
HCT: 40 % (ref 35.0–45.0)
Hemoglobin: 13.2 g/dL (ref 11.7–15.5)
Lymphs Abs: 1349 {cells}/uL (ref 850–3900)
MCH: 28.8 pg (ref 27.0–33.0)
MCHC: 33 g/dL (ref 32.0–36.0)
MCV: 87.1 fL (ref 80.0–100.0)
MPV: 10.5 fL (ref 7.5–12.5)
Monocytes Relative: 11.7 %
Neutro Abs: 2669 {cells}/uL (ref 1500–7800)
Neutrophils Relative %: 55.6 %
Platelets: 410 10*3/uL — ABNORMAL HIGH (ref 140–400)
RBC: 4.59 10*6/uL (ref 3.80–5.10)
RDW: 12 % (ref 11.0–15.0)
Total Lymphocyte: 28.1 %
WBC: 4.8 10*3/uL (ref 3.8–10.8)

## 2022-12-07 LAB — PATHOLOGIST SMEAR REVIEW

## 2022-12-09 ENCOUNTER — Telehealth (INDEPENDENT_AMBULATORY_CARE_PROVIDER_SITE_OTHER): Payer: Self-pay

## 2022-12-09 ENCOUNTER — Encounter: Payer: Self-pay | Admitting: Family

## 2022-12-09 DIAGNOSIS — G43119 Migraine with aura, intractable, without status migrainosus: Secondary | ICD-10-CM

## 2022-12-09 NOTE — Telephone Encounter (Signed)
LVM for pt TCB and schedule appt  LO DVT

## 2022-12-12 ENCOUNTER — Other Ambulatory Visit (HOSPITAL_BASED_OUTPATIENT_CLINIC_OR_DEPARTMENT_OTHER): Payer: Self-pay | Admitting: Obstetrics & Gynecology

## 2022-12-12 DIAGNOSIS — Z79890 Hormone replacement therapy (postmenopausal): Secondary | ICD-10-CM

## 2022-12-12 DIAGNOSIS — Z9229 Personal history of other drug therapy: Secondary | ICD-10-CM

## 2022-12-13 ENCOUNTER — Encounter (HOSPITAL_BASED_OUTPATIENT_CLINIC_OR_DEPARTMENT_OTHER): Payer: Self-pay | Admitting: Obstetrics & Gynecology

## 2022-12-13 MED ORDER — TOPIRAMATE 25 MG PO TABS
25.0000 mg | ORAL_TABLET | Freq: Two times a day (BID) | ORAL | 1 refills | Status: AC
Start: 2022-12-13 — End: ?

## 2022-12-16 ENCOUNTER — Other Ambulatory Visit (HOSPITAL_BASED_OUTPATIENT_CLINIC_OR_DEPARTMENT_OTHER): Payer: Self-pay | Admitting: *Deleted

## 2022-12-16 DIAGNOSIS — Z79890 Hormone replacement therapy (postmenopausal): Secondary | ICD-10-CM

## 2022-12-16 DIAGNOSIS — Z9229 Personal history of other drug therapy: Secondary | ICD-10-CM

## 2022-12-16 MED ORDER — ESTRADIOL-NORETHINDRONE ACET 0.5-0.1 MG PO TABS: ORAL_TABLET | ORAL | 2 refills | Status: DC

## 2022-12-16 NOTE — Progress Notes (Signed)
Pharmacy states that they did not receive refills on patients estradiol-norethidrone. Refill sent electronically. Pt to let us now if pharmacy does not receive.

## 2022-12-17 ENCOUNTER — Encounter (INDEPENDENT_AMBULATORY_CARE_PROVIDER_SITE_OTHER): Payer: Self-pay

## 2022-12-23 ENCOUNTER — Other Ambulatory Visit: Payer: Self-pay | Admitting: Family

## 2022-12-23 DIAGNOSIS — M79605 Pain in left leg: Secondary | ICD-10-CM

## 2022-12-23 DIAGNOSIS — I739 Peripheral vascular disease, unspecified: Secondary | ICD-10-CM

## 2022-12-27 ENCOUNTER — Telehealth: Payer: Self-pay | Admitting: Family

## 2022-12-27 NOTE — Telephone Encounter (Signed)
[facility] called stating the pt's insurance is requiring a PA for ultrasound, Dugal referred for pt. Call back # [phone]

## 2022-12-29 ENCOUNTER — Ambulatory Visit
Admission: RE | Admit: 2022-12-29 | Discharge: 2022-12-29 | Disposition: A | Discharge status: Home / Self Care | Payer: BC Managed Care – PPO | Source: Ambulatory Visit | Attending: Family | Admitting: Family

## 2022-12-29 DIAGNOSIS — M79605 Pain in left leg: Secondary | ICD-10-CM | POA: Diagnosis present

## 2022-12-29 DIAGNOSIS — M79604 Pain in right leg: Secondary | ICD-10-CM | POA: Diagnosis present

## 2022-12-29 DIAGNOSIS — I739 Peripheral vascular disease, unspecified: Secondary | ICD-10-CM | POA: Insufficient documentation

## 2022-12-30 ENCOUNTER — Encounter: Payer: Self-pay | Admitting: Family

## 2022-12-30 DIAGNOSIS — M79604 Pain in right leg: Secondary | ICD-10-CM

## 2023-01-03 NOTE — Telephone Encounter (Signed)
Ordered u/s venous bil lower extremity Does this need auth

## 2023-01-06 ENCOUNTER — Ambulatory Visit (HOSPITAL_BASED_OUTPATIENT_CLINIC_OR_DEPARTMENT_OTHER)
Admission: RE | Admit: 2023-01-06 | Discharge: 2023-01-06 | Disposition: A | Discharge status: Home / Self Care | Payer: BC Managed Care – PPO | Source: Ambulatory Visit | Attending: Obstetrics & Gynecology | Admitting: Obstetrics & Gynecology

## 2023-01-06 DIAGNOSIS — Z1231 Encounter for screening mammogram for malignant neoplasm of breast: Secondary | ICD-10-CM | POA: Diagnosis present

## 2023-01-06 DIAGNOSIS — M8588 Other specified disorders of bone density and structure, other site: Secondary | ICD-10-CM | POA: Insufficient documentation

## 2023-01-06 DIAGNOSIS — Z13820 Encounter for screening for osteoporosis: Secondary | ICD-10-CM | POA: Insufficient documentation

## 2023-01-13 ENCOUNTER — Ambulatory Visit: Payer: BC Managed Care – PPO

## 2023-02-01 ENCOUNTER — Telehealth (HOSPITAL_BASED_OUTPATIENT_CLINIC_OR_DEPARTMENT_OTHER): Payer: Self-pay

## 2023-02-01 NOTE — Telephone Encounter (Signed)
Patient called and would like for office manager to please call her about bill.

## 2023-02-04 ENCOUNTER — Telehealth (HOSPITAL_BASED_OUTPATIENT_CLINIC_OR_DEPARTMENT_OTHER): Payer: Self-pay | Admitting: *Deleted

## 2023-02-04 NOTE — Telephone Encounter (Signed)
PT called to discuss why BD bill went to coinsurance.  I advised dx for BD was osteopenia although can add screening for osteoporosis with the other diagnosis to see if will get paid 100%. Called billing team to update.   Alos, pt asked if you had reviewed the BD results.  I did not see if you had or not.  I advised since lumbar spine worse now showing -3.6 you would probably want to discuss it in detail with her either in person or virtually.  Please advise.  I told her I would call her back.  Thanks Sherrilyn Rist CMA

## 2023-02-04 NOTE — Telephone Encounter (Signed)
TC to pt.  Pt asked if preventative Dx code could be added to claim for 01/06/23 BD due to being billed towards her co-insurance.  Preventative/screening is paid.  Since diagnosis for order was osteopenia/low bone mass then yes screening for osteoporosis can be added as diagnosis.  I will call billing and update.  Pt informed.  She asked if Dr Hyacinth Meeker had reviewed results.  I advised I will have Dr Hyacinth Meeker review and she will be contacted.  Advised probably needs visit in person or virtual to discuss since osteoporosis.  She is agreeable.  KWD

## 2023-03-01 NOTE — Telephone Encounter (Signed)
DOB verified. Informed pt that her BMD is showing some osteoporosis but only in her spine.  Her hips show only osteopenia.  Advised that Dr. Hyacinth Meeker would like her to have a PTH with calcium, TSH and Vit D done. Pt provided with appt. Advised that if she wants to do some research about treatment then Dr. Hyacinth Meeker , would like for her to research Actonel as this is one of the oral meds that helps the spine the best. Pt verbalized understanding.

## 2023-03-16 ENCOUNTER — Other Ambulatory Visit (HOSPITAL_BASED_OUTPATIENT_CLINIC_OR_DEPARTMENT_OTHER): Payer: BC Managed Care – PPO

## 2023-03-17 ENCOUNTER — Other Ambulatory Visit (HOSPITAL_BASED_OUTPATIENT_CLINIC_OR_DEPARTMENT_OTHER): Payer: BC Managed Care – PPO

## 2023-03-17 DIAGNOSIS — M810 Age-related osteoporosis without current pathological fracture: Secondary | ICD-10-CM

## 2023-03-24 ENCOUNTER — Ambulatory Visit: Payer: BC Managed Care – PPO | Admitting: Internal Medicine

## 2023-03-24 VITALS — BP 106/70 | HR 93 | Temp 98.6°F | Ht 65.0 in | Wt 139.0 lb

## 2023-03-24 DIAGNOSIS — J029 Acute pharyngitis, unspecified: Secondary | ICD-10-CM

## 2023-03-24 DIAGNOSIS — J020 Streptococcal pharyngitis: Secondary | ICD-10-CM | POA: Diagnosis not present

## 2023-03-24 LAB — POCT RAPID STREP A (OFFICE): Rapid Strep A Screen: POSITIVE — AB

## 2023-03-24 MED ORDER — PENICILLIN V POTASSIUM 500 MG PO TABS: 500.0000 mg | ORAL_TABLET | Freq: Three times a day (TID) | ORAL | 0 refills | Status: AC

## 2023-03-24 NOTE — Assessment & Plan Note (Signed)
Rapid strep positive Will treat with penicillin VK 500 tid x 10 days Analgesics, etc

## 2023-03-24 NOTE — Progress Notes (Signed)
Subjective:    Patient ID: Gloria Rivera, female    DOB: 1960-07-22, 62 y.o.   MRN: [id number]  HPI Here due to sore throat  Exposed to strep 3 times this week---2 grandkids and daughter Late last night---some left side sore throat---then terrible this morning Pain with swallowing or even talking Being careful with food---soft food Drinking okay  Slight fever Taking ibuprofen/acetaminophen Dizzy/body aches No cough Notes gland on left neck  Current Outpatient Medications on File Prior to Visit  Medication Sig Dispense Refill   albuterol (VENTOLIN HFA) 108 (90 Base) MCG/ACT inhaler Inhale 2 puffs into the lungs every 6 (six) hours as needed for wheezing or shortness of breath.     Calcium Carbonate (CALCIUM 600 PO) Take 1 tablet by mouth daily.     celecoxib (CELEBREX) 200 MG capsule TAKE ONE TO 2 TABLETS BY MOUTH DAILY AS NEEDED FOR PAIN. 180 capsule 1   D-Mannose 500 MG CAPS Take 1 Dose by mouth daily. 90 capsule 5   EPINEPHrine 0.3 mg/0.3 mL IJ SOAJ injection Inject 0.3 mg into the muscle as needed for anaphylaxis. 1 each 1   Estradiol-Norethindrone Acet 0.5-0.1 MG tablet 1/2 tab daily 45 tablet 2   SUMAtriptan (IMITREX) 50 MG tablet Take 2 tablets (100 mg total) by mouth every 2 (two) hours as needed for migraine. May repeat in 2 hours if headache persists or recurs. 30 tablet 2   topiramate (TOPAMAX) 25 MG tablet Take 1 tablet (25 mg total) by mouth 2 (two) times daily. 180 tablet 1   venlafaxine XR (EFFEXOR-XR) 150 MG 24 hr capsule Take 1 capsule (150 mg total) by mouth daily with breakfast. 90 capsule 3   VITAMIN D PO Take 1 capsule by mouth daily.     No current facility-administered medications on file prior to visit.    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 x 3   TONSILLECTOMY     VITRECTOMY Right 03/12/2021   done at [facility]    Family History  Problem Relation Age of Onset   Obesity Mother        smoker   COPD Father        smoker   Breast cancer Maternal Grandmother 42   Heart attack Maternal Uncle     Social History   Socioeconomic History   Marital status: Married    Spouse name: Not on file   Number of children: 3   Years of education: Not on file   Highest education level: Master's degree (e.g., MA, MS, MEng, MEd, MSW, MBA)  Occupational History   Occupation: Retired  Tobacco Use   Smoking status: Never    Passive exposure: Never   Smokeless tobacco: Never  Vaping Use   Vaping status:  Never Used  Substance and Sexual Activity   Alcohol use: Yes    Alcohol/week: 2.0 standard drinks of alcohol    Types: 2 Glasses of wine per week    Comment: beer/wine/liquor twice a month   Drug use: Never   Sexual activity: Yes    Partners: Male    Birth control/protection: Post-menopausal    Comment: ablation  Other Topics Concern   Not on file  Social History Narrative   No exercise. 2 sodas of diet pepsi a day    Social Drivers of Corporate investment banker Strain: Low Risk  (03/24/2023)   Overall Financial Resource Strain (CARDIA)    Difficulty of Paying Living Expenses: Not hard at all  Food Insecurity: No Food Insecurity (03/24/2023)   Hunger Vital Sign    Worried About Running Out of Food in the Last Year: Never true    Ran Out of Food in the Last Year: Never true  Transportation Needs: No Transportation Needs (03/24/2023)   PRAPARE - Scientist, research (physical sciences) (Medical): No    Lack of Transportation (Non-Medical): No  Physical Activity: Unknown (03/24/2023)   Exercise Vital Sign    Days of Exercise per Week: 0 days    Minutes of Exercise per Session: Not on file  Stress: No Stress Concern Present (03/24/2023)   [employer] of Occupational Health - Occupational Stress Questionnaire    Feeling of Stress : Not at all  Social Connections: Socially Integrated (03/24/2023)   Social Connection and Isolation Panel [NHANES]    Frequency of Communication with Friends and Family: More than three times a week    Frequency of Social Gatherings with Friends and Family: Three times a week    Attends Religious Services: More than 4 times per year    Active Member of Clubs or Organizations: Yes    Attends Banker Meetings: More than 4 times per year    Marital Status: Married  Catering manager Violence: Unknown (07/01/2021)   Received from [employer], [facility]   HITS    Physically Hurt: Not on file    Insult or Talk Down To: Not on file    Threaten Physical Harm: Not on file    Scream or Curse: Not on file   Review of Systems No rash  No N/V     Objective:   Physical Exam Constitutional:      Appearance: Normal appearance.  HENT:     Head:     Comments: No sinus tenderness    Right Ear: Tympanic membrane and ear canal normal.     Left Ear: Tympanic membrane and ear canal normal.     Mouth/Throat:     Comments: Pharyngeal injection without enlarged tonsils or exudate Neck:     Comments: Tender left anterior cervical node Pulmonary:     Effort: Pulmonary effort is normal.     Breath sounds: Normal breath sounds. No wheezing or rales.  Musculoskeletal:     Cervical back: Neck supple.  Neurological:     Mental Status: She is alert.            Assessment & Plan:

## 2023-04-01 ENCOUNTER — Encounter (HOSPITAL_BASED_OUTPATIENT_CLINIC_OR_DEPARTMENT_OTHER): Payer: Self-pay | Admitting: Obstetrics & Gynecology

## 2023-05-16 ENCOUNTER — Ambulatory Visit: Payer: Self-pay | Admitting: Family

## 2023-05-16 NOTE — Telephone Encounter (Signed)
Copied from CRM [id number]. Topic: Clinical - Red Word Triage >> May 16, 2023  1:42 PM Gurney Maxin H wrote: [facility] that prompted transfer to Nurse Triage: Bursitis in the hip in pain, sleeping on it 7-8 walking 3.  Patient just wanted to schedule an appointment to see pcp for continuing hip pain.  Declined to answer triage questions.  Office updated and apt. Made.  Reason for Disposition  Nursing judgment or information in reference  Answer Assessment - Initial Assessment Questions 1. REASON FOR CALL: "What is your main concern right now?"     Left hip bursitis  Protocols used: No Guideline Available-A-AH

## 2023-05-16 NOTE — Telephone Encounter (Signed)
 Noted

## 2023-05-18 ENCOUNTER — Telehealth: Payer: Self-pay | Admitting: *Deleted

## 2023-05-18 ENCOUNTER — Ambulatory Visit: Payer: Self-pay | Admitting: Family

## 2023-05-18 NOTE — Telephone Encounter (Signed)
**  LEFT VM: PT EITHER NEEDS TO CHANGE TO VIRTUAL VISIT OR R/S WITH A PROVIDER IN OFFICE**   Appt is tomorrow 05/19/23

## 2023-05-19 ENCOUNTER — Telehealth: Payer: Self-pay | Admitting: Family

## 2023-05-19 ENCOUNTER — Ambulatory Visit: Payer: Self-pay | Admitting: Family

## 2023-05-19 DIAGNOSIS — M810 Age-related osteoporosis without current pathological fracture: Secondary | ICD-10-CM

## 2023-05-19 DIAGNOSIS — G43119 Migraine with aura, intractable, without status migrainosus: Secondary | ICD-10-CM

## 2023-05-19 DIAGNOSIS — R7989 Other specified abnormal findings of blood chemistry: Secondary | ICD-10-CM | POA: Diagnosis not present

## 2023-05-19 DIAGNOSIS — M7071 Other bursitis of hip, right hip: Secondary | ICD-10-CM

## 2023-05-19 DIAGNOSIS — M2550 Pain in unspecified joint: Secondary | ICD-10-CM

## 2023-05-19 DIAGNOSIS — E538 Deficiency of other specified B group vitamins: Secondary | ICD-10-CM | POA: Insufficient documentation

## 2023-05-19 DIAGNOSIS — M7072 Other bursitis of hip, left hip: Secondary | ICD-10-CM

## 2023-05-19 DIAGNOSIS — R7303 Prediabetes: Secondary | ICD-10-CM

## 2023-05-19 DIAGNOSIS — M7070 Other bursitis of hip, unspecified hip: Secondary | ICD-10-CM | POA: Insufficient documentation

## 2023-05-19 MED ORDER — SUMATRIPTAN SUCCINATE 100 MG PO TABS
ORAL_TABLET | ORAL | 0 refills | Status: DC
Start: 2023-05-19 — End: 2023-10-21

## 2023-05-19 MED ORDER — PREDNISONE 10 MG (21) PO TBPK
ORAL_TABLET | ORAL | 0 refills | Status: DC
Start: 2023-05-19 — End: 2023-08-11

## 2023-05-19 NOTE — Assessment & Plan Note (Signed)
Currently being followed by her gynecologist.  I will draw labs today and sent to her gynecologist once resulted.  Gynecology is pending these labs to determine the next course of treatment plan.  Patient is never taken any medication for osteoporosis

## 2023-05-19 NOTE — Assessment & Plan Note (Signed)
Discussed with her prediabetic diet and to exercise as tolerated.Marland Kitchen

## 2023-05-19 NOTE — Progress Notes (Signed)
Virtual Visit via Video note  I connected with RAYYA YAGI on 05/19/23 at home by video and verified that I am speaking with the correct person using two identifiers.The provider, Mort Sawyers, FNP is located in their home at time of visit.  I discussed the limitations, risks, security and privacy concerns of performing an evaluation and management service by video and the availability of in person appointments. I also discussed with the patient that there may be a patient responsible charge related to this service. The patient expressed understanding and agreed to proceed.  Subjective: PCP: Mort Sawyers, FNP  Chief Complaint  Patient presents with   Hip Pain    Both hip but L is worse than the R.    Hip Pain     Has had two other occurences of hip bursitis, first was 11 years ago and then again about 5 years ago. Left hip is really bothering her and causing her pain worse at night time. She does state also in the right hip but less pain. Taking over the counter tylenol but is not really giving her any relief. Has been using heat and cold without relief either, trying to sleep on a donut pillow without relief. She takes daily celebrex. No known injury in the past. There is some pain that is aggravating by walking 2 out of 3 pain in the daytime up to 7 at night time.   She did have xray left hip in 2020 and per report with no acute findings. Unfortunately document does not include the xray images and or findings.   Bone density does show significant osteoporosis in AP spine. in her left hip there is osteopenia, last bone density was 01/06/23. She is followed by gynecology and they are pending treatment options for lab work.   2021 ana and RF negative, will repeat today           ROS: Per HPI  Current Outpatient Medications:    albuterol (VENTOLIN HFA) 108 (90 Base) MCG/ACT inhaler, Inhale 2 puffs into the lungs every 6 (six) hours as needed for wheezing or shortness  of breath., Disp: , Rfl:    Calcium Carbonate (CALCIUM 600 PO), Take 1 tablet by mouth daily., Disp: , Rfl:    celecoxib (CELEBREX) 200 MG capsule, TAKE ONE TO 2 TABLETS BY MOUTH DAILY AS NEEDED FOR PAIN., Disp: 180 capsule, Rfl: 1   D-Mannose 500 MG CAPS, Take 1 Dose by mouth daily., Disp: 90 capsule, Rfl: 5   EPINEPHrine 0.3 mg/0.3 mL IJ SOAJ injection, Inject 0.3 mg into the muscle as needed for anaphylaxis., Disp: 1 each, Rfl: 1   Estradiol-Norethindrone Acet 0.5-0.1 MG tablet, 1/2 tab daily, Disp: 45 tablet, Rfl: 2   predniSONE (STERAPRED UNI-PAK 21 TAB) 10 MG (21) TBPK tablet, Take as directed, Disp: 1 each, Rfl: 0   SUMAtriptan (IMITREX) 100 MG tablet, Take one tablet at onset of headache, repeat in two hours if needed for max of 200 mg daily, Disp: 10 tablet, Rfl: 0   topiramate (TOPAMAX) 25 MG tablet, Take 1 tablet (25 mg total) by mouth 2 (two) times daily., Disp: 180 tablet, Rfl: 1   venlafaxine XR (EFFEXOR-XR) 150 MG 24 hr capsule, Take 1 capsule (150 mg total) by mouth daily with breakfast., Disp: 90 capsule, Rfl: 3   VITAMIN D PO, Take 1 capsule by mouth daily., Disp: , Rfl:   Observations/Objective: Physical Exam Constitutional:      General: She is not in acute distress.  Appearance: Normal appearance. She is normal weight. She is not ill-appearing, toxic-appearing or diaphoretic.  HENT:     Head: Normocephalic.  Cardiovascular:     Rate and Rhythm: Normal rate.  Pulmonary:     Effort: Pulmonary effort is normal.  Musculoskeletal:        General: Normal range of motion.  Neurological:     General: No focal deficit present.     Mental Status: She is alert and oriented to person, place, and time. Mental status is at baseline.  Psychiatric:        Mood and Affect: Mood normal.        Behavior: Behavior normal.        Thought Content: Thought content normal.        Judgment: Judgment normal.     Assessment and Plan: Low calcium levels  Low serum vitamin B12 -      Vitamin B12; Future  Prediabetes Assessment & Plan: Discussed with her prediabetic diet and to exercise as tolerated..  Orders: -     Hemoglobin A1c; Future  Elevated platelet count -     CBC with Differential/Platelet; Future  Age-related osteoporosis without current pathological fracture Assessment & Plan: Currently being followed by her gynecologist.  I will draw labs today and sent to her gynecologist once resulted.  Gynecology is pending these labs to determine the next course of treatment plan.  Patient is never taken any medication for osteoporosis  Orders: -     PTH, intact and calcium; Future -     TSH; Future -     VITAMIN D 25 Hydroxy (Vit-D Deficiency, Fractures); Future  Bursitis of other bursa of both hips Assessment & Plan: Limited on inability for physical examination due to video visit however I will send in a prednisone pack for patient to start and take as directed.  She can also continue with Tylenol as needed and alternate with heat and/or ice whichever might feel better.  Reviewed bone density and she does have osteopenia in the left hip as well.  Did send in some rehab exercises to her MyChart, we can also consider physical therapy in the future if there are more recurrent flares.  Could also consider orthopedic referral.  Orders: -     predniSONE; Take as directed  Dispense: 1 each; Refill: 0  Polyarthralgia -     Rheumatoid factor; Future -     ANA w/Reflex; Future  Intractable migraine with aura without status migrainosus Assessment & Plan: Patient states she typically takes 100 mg at a time as needed when she gets a migraine.  We will send in a new prescription for sumatriptan 100 mg once daily, can repeat in 2 hours if needed for a maximum dosage of 200 mg once daily.  Avoid triggers as able  Orders: -     SUMAtriptan Succinate; Take one tablet at onset of headache, repeat in two hours if needed for max of 200 mg daily  Dispense: 10 tablet; Refill:  0    Follow Up Instructions: Return if symptoms worsen or fail to improve.   I discussed the assessment and treatment plan with the patient. The patient was provided an opportunity to ask questions and all were answered. The patient agreed with the plan and demonstrated an understanding of the instructions.   The patient was advised to call back or seek an in-person evaluation if the symptoms worsen or if the condition fails to improve as anticipated.  The above assessment  and management plan was discussed with the patient. The patient verbalized understanding of and has agreed to the management plan. Patient is aware to call the clinic if symptoms persist or worsen. Patient is aware when to return to the clinic for a follow-up visit. Patient educated on when it is appropriate to go to the emergency department.     Mort Sawyers, MSN, APRN, FNP-C [facility]

## 2023-05-19 NOTE — Assessment & Plan Note (Signed)
Patient states she typically takes 100 mg at a time as needed when she gets a migraine.  We will send in a new prescription for sumatriptan 100 mg once daily, can repeat in 2 hours if needed for a maximum dosage of 200 mg once daily.  Avoid triggers as able

## 2023-05-19 NOTE — Assessment & Plan Note (Signed)
Limited on inability for physical examination due to video visit however I will send in a prednisone pack for patient to start and take as directed.  She can also continue with Tylenol as needed and alternate with heat and/or ice whichever might feel better.  Reviewed bone density and she does have osteopenia in the left hip as well.  Did send in some rehab exercises to her MyChart, we can also consider physical therapy in the future if there are more recurrent flares.  Could also consider orthopedic referral.

## 2023-06-09 ENCOUNTER — Other Ambulatory Visit (INDEPENDENT_AMBULATORY_CARE_PROVIDER_SITE_OTHER)

## 2023-06-09 DIAGNOSIS — M810 Age-related osteoporosis without current pathological fracture: Secondary | ICD-10-CM

## 2023-06-09 DIAGNOSIS — R7303 Prediabetes: Secondary | ICD-10-CM

## 2023-06-09 DIAGNOSIS — R7989 Other specified abnormal findings of blood chemistry: Secondary | ICD-10-CM

## 2023-06-09 DIAGNOSIS — M2550 Pain in unspecified joint: Secondary | ICD-10-CM

## 2023-06-09 DIAGNOSIS — E538 Deficiency of other specified B group vitamins: Secondary | ICD-10-CM

## 2023-06-10 ENCOUNTER — Encounter: Payer: Self-pay | Admitting: Family

## 2023-06-11 LAB — CBC WITH DIFFERENTIAL/PLATELET
Basophils Absolute: 0 10*3/uL (ref 0.0–0.2)
Basos: 1 %
EOS (ABSOLUTE): 0.1 10*3/uL (ref 0.0–0.4)
Eos: 1 %
Hematocrit: 38.9 % (ref 34.0–46.6)
Hemoglobin: 13 g/dL (ref 11.1–15.9)
Immature Grans (Abs): 0 10*3/uL (ref 0.0–0.1)
Immature Granulocytes: 0 %
Lymphocytes Absolute: 1.6 10*3/uL (ref 0.7–3.1)
Lymphs: 25 %
MCH: 30.6 pg (ref 26.6–33.0)
MCHC: 33.4 g/dL (ref 31.5–35.7)
MCV: 92 fL (ref 79–97)
Monocytes Absolute: 0.7 10*3/uL (ref 0.1–0.9)
Monocytes: 11 %
Neutrophils Absolute: 4 10*3/uL (ref 1.4–7.0)
Neutrophils: 62 %
Platelets: 355 10*3/uL (ref 150–450)
RBC: 4.25 x10E6/uL (ref 3.77–5.28)
RDW: 13 % (ref 11.7–15.4)
WBC: 6.5 10*3/uL (ref 3.4–10.8)

## 2023-06-11 LAB — PTH, INTACT AND CALCIUM
Calcium: 8.8 mg/dL (ref 8.7–10.3)
PTH: 37 pg/mL (ref 15–65)

## 2023-06-11 LAB — RHEUMATOID FACTOR: Rheumatoid fact SerPl-aCnc: <10 [IU]/mL (ref <14.0)

## 2023-06-11 LAB — HEMOGLOBIN A1C
Est. average glucose Bld gHb Est-mCnc: 114 mg/dL
Hgb A1c MFr Bld: 5.6 % (ref 4.8–5.6)

## 2023-06-11 LAB — VITAMIN B12: Vitamin B-12: 1150 pg/mL (ref 232–1245)

## 2023-06-11 LAB — ANA W/REFLEX: Anti Nuclear Antibody (ANA): NEGATIVE

## 2023-06-11 LAB — TSH: TSH: 1.36 u[IU]/mL (ref 0.450–4.500)

## 2023-06-11 LAB — VITAMIN D 25 HYDROXY (VIT D DEFICIENCY, FRACTURES): Vit D, 25-Hydroxy: 50.8 ng/mL (ref 30.0–100.0)

## 2023-06-13 ENCOUNTER — Encounter (HOSPITAL_BASED_OUTPATIENT_CLINIC_OR_DEPARTMENT_OTHER): Payer: Self-pay | Admitting: Obstetrics & Gynecology

## 2023-06-30 ENCOUNTER — Other Ambulatory Visit (HOSPITAL_BASED_OUTPATIENT_CLINIC_OR_DEPARTMENT_OTHER): Payer: Self-pay | Admitting: Obstetrics & Gynecology

## 2023-06-30 DIAGNOSIS — M810 Age-related osteoporosis without current pathological fracture: Secondary | ICD-10-CM

## 2023-06-30 MED ORDER — RISEDRONATE SODIUM 150 MG PO TABS
150.0000 mg | ORAL_TABLET | ORAL | 3 refills | Status: DC
Start: 2023-06-30 — End: 2024-02-13

## 2023-07-25 ENCOUNTER — Encounter: Payer: Self-pay | Admitting: Family

## 2023-08-11 ENCOUNTER — Other Ambulatory Visit (HOSPITAL_COMMUNITY)
Admission: RE | Admit: 2023-08-11 | Discharge: 2023-08-11 | Disposition: A | Discharge status: Home / Self Care | Source: Ambulatory Visit | Attending: Obstetrics & Gynecology | Admitting: Obstetrics & Gynecology

## 2023-08-11 ENCOUNTER — Encounter (HOSPITAL_BASED_OUTPATIENT_CLINIC_OR_DEPARTMENT_OTHER): Payer: Self-pay | Admitting: Obstetrics & Gynecology

## 2023-08-11 ENCOUNTER — Ambulatory Visit (INDEPENDENT_AMBULATORY_CARE_PROVIDER_SITE_OTHER): Payer: BC Managed Care – PPO | Admitting: Obstetrics & Gynecology

## 2023-08-11 VITALS — BP 120/80 | HR 82 | Ht 65.0 in | Wt 138.0 lb

## 2023-08-11 DIAGNOSIS — Z124 Encounter for screening for malignant neoplasm of cervix: Secondary | ICD-10-CM

## 2023-08-11 DIAGNOSIS — M7072 Other bursitis of hip, left hip: Secondary | ICD-10-CM

## 2023-08-11 DIAGNOSIS — M810 Age-related osteoporosis without current pathological fracture: Secondary | ICD-10-CM | POA: Diagnosis not present

## 2023-08-11 DIAGNOSIS — F419 Anxiety disorder, unspecified: Secondary | ICD-10-CM

## 2023-08-11 DIAGNOSIS — Z01419 Encounter for gynecological examination (general) (routine) without abnormal findings: Secondary | ICD-10-CM

## 2023-08-11 DIAGNOSIS — M7071 Other bursitis of hip, right hip: Secondary | ICD-10-CM

## 2023-08-11 DIAGNOSIS — Z79890 Hormone replacement therapy (postmenopausal): Secondary | ICD-10-CM

## 2023-08-11 MED ORDER — PREDNISONE 10 MG (21) PO TBPK
ORAL_TABLET | ORAL | 0 refills | Status: DC
Start: 2023-08-11 — End: 2023-10-21

## 2023-08-11 MED ORDER — VENLAFAXINE HCL ER 150 MG PO CP24: 150.0000 mg | ORAL_CAPSULE | Freq: Every day | ORAL | 3 refills | Status: AC

## 2023-08-11 NOTE — Addendum Note (Signed)
 Addended by: Lillian Rein on: 08/11/2023 05:19 PM   Modules accepted: Orders

## 2023-08-11 NOTE — Progress Notes (Signed)
 Annual Exam Patient name: Gloria Rivera MRN [id number]  Date of birth: May 10, 1960 Chief Complaint:   Annual Exam  History of Present Illness:   Gloria Rivera is a 63 y.o. [id number] Caucasian female being seen today for a routine annual exam.  Denies vaginal bleeding.  Had significant issues with what seemed like a UTI last year.  Ultimately was interstitial cystitis.  Had three bladder instillations and symptoms resolved.    Has osteoporosis.  Had some additional blood work done which I reviewed today.  Has issues with bursitis.  Was treated back in February by Tabitha Dugal.  Is better but not resolved.  Has needed two coureses in the past.  Requesting this again today.  Will treat but if does not resolve,will need to see Sports Medicine and she is ok with this.    Having some anger at times.  Is taking effexor  150mg  daily.    No LMP recorded. Patient is postmenopausal.   Last pap  07/24/2020  . Results were: NILM w/ HRHPV negative. H/O abnormal pap: no Last mammogram: 01/06/2023. Results were: normal. Family h/o breast cancer: yes MGM Last colonoscopy: declined.  Cologuard was 07/2021 DEXA:  12/2022, T score -3.6 in L1-L2, hips stable.  Consider repeat BMD 04/2024 after 1year of medication use.     08/09/2022    4:03 PM 07/19/2022    4:14 PM 05/24/2022    8:24 AM 02/15/2022   11:05 AM 01/19/2022    2:12 PM  Depression screen PHQ 2/9  Decreased Interest 0 0 0 0 0  Down, Depressed, Hopeless 0 0 0 0 0  PHQ - 2 Score 0 0 0 0 0        09/17/2019   10:32 AM 08/07/2019    3:16 PM  GAD 7 : Generalized Anxiety Score  Nervous, Anxious, on Edge 0 0  Control/stop worrying 0 1  Worry too much - different things 1 1  Trouble relaxing 1 1  Restless 3 2  Easily annoyed or irritable 1 1  Afraid - awful might happen 0 0  Total GAD 7 Score 6 6  Anxiety Difficulty Not difficult at all Somewhat difficult     Review of Systems:   Pertinent items are noted in HPI  Denies any bladder  or bowel changes.   Pertinent History Reviewed:  Reviewed past medical,surgical, social and family history.  Reviewed problem list, medications and allergies. Physical Assessment:   Vitals:   08/11/23 1304  BP: 120/80  Pulse: 82  Weight: 138 lb (62.6 kg)  Height: 5\' 5"  (1.651 m)  Body mass index is 22.96 kg/m.        Physical Examination:   General appearance - well appearing, and in no distress  Mental status - alert, oriented to person, place, and time  Psych:  She has a normal mood and affect  Skin - warm and dry, normal color, no suspicious lesions noted  Chest - effort normal, all lung fields clear to auscultation bilaterally  Heart - normal rate and regular rhythm  Neck:  midline trachea, no thyromegaly or nodules  Breasts - breasts appear normal, no suspicious masses, no skin or nipple changes or  axillary nodes  Abdomen - soft, nontender, nondistended, no masses or organomegaly  Pelvic - VULVA: normal appearing vulva with no masses, tenderness or lesions   VAGINA: normal appearing vagina with normal color and discharge, no lesions   CERVIX: normal appearing cervix without discharge or lesions, no CMT  Thin prep pap is obtained today  UTERUS: uterus is felt to be normal size, shape, consistency and nontender   ADNEXA: No adnexal masses or tenderness noted.  Rectal - normal rectal, good sphincter tone, no masses felt.   Extremities:  No swelling or varicosities noted  Chaperone present for exam  Assessment & Plan:  1. Well woman exam with routine gynecological exam (Primary) - Pap smear with HR HPV obtained today - Mammogram 12/2022 - Bone mineral density 12/2022 - lab work done with PCP done in 05/2023 - vaccines reviewed/updated  2. Hormone replacement therapy (HRT) - she has been taking 1/2 tab daily and is ready to stop.  Instructions given  3. Age-related osteoporosis without current pathological fracture - on Actonel .  Does not need RX - will consider  repeating in 04/2023 or in two years, depending on insurance  4. Bursitis of other bursa of both hips - predniSONE  (STERAPRED UNI-PAK 21 TAB) 10 MG (21) TBPK tablet; Take as directed  Dispense: 1 each; Refill: 0  5. Anxiety - psychiatry consult recommended.  Options given - venlafaxine  XR (EFFEXOR -XR) 150 MG 24 hr capsule; Take 1 capsule (150 mg total) by mouth daily with breakfast.  Dispense: 90 capsule; Refill: 3    Meds:  Meds ordered this encounter  Medications   predniSONE  (STERAPRED UNI-PAK 21 TAB) 10 MG (21) TBPK tablet    Sig: Take as directed    Dispense:  1 each    Refill:  0   venlafaxine  XR (EFFEXOR -XR) 150 MG 24 hr capsule    Sig: Take 1 capsule (150 mg total) by mouth daily with breakfast.    Dispense:  90 capsule    Refill:  3    Follow-up: Return in about 1 year (around 08/10/2024).  Lillian Rein, MD 08/11/2023 1:43 PM

## 2023-08-11 NOTE — Patient Instructions (Addendum)
 [facility] [address] [place],  [place]  [address]  Main: [phone]  [facility] [address] [place],  [place]  [address]  Main: [phone]  First pneumonia vaccine to consider is called Prevnar.

## 2023-08-16 ENCOUNTER — Encounter (INDEPENDENT_AMBULATORY_CARE_PROVIDER_SITE_OTHER): Payer: Self-pay

## 2023-08-16 LAB — CYTOLOGY - PAP
Comment: NEGATIVE
Diagnosis: NEGATIVE
High risk HPV: NEGATIVE

## 2023-08-17 ENCOUNTER — Ambulatory Visit (HOSPITAL_BASED_OUTPATIENT_CLINIC_OR_DEPARTMENT_OTHER): Payer: Self-pay | Admitting: Obstetrics & Gynecology

## 2023-08-30 ENCOUNTER — Other Ambulatory Visit: Payer: Self-pay | Admitting: Family

## 2023-08-30 DIAGNOSIS — M19041 Primary osteoarthritis, right hand: Secondary | ICD-10-CM

## 2023-09-09 ENCOUNTER — Other Ambulatory Visit: Payer: Self-pay | Admitting: Family

## 2023-09-09 DIAGNOSIS — G43119 Migraine with aura, intractable, without status migrainosus: Secondary | ICD-10-CM

## 2023-09-18 ENCOUNTER — Encounter (HOSPITAL_BASED_OUTPATIENT_CLINIC_OR_DEPARTMENT_OTHER): Payer: Self-pay | Admitting: Obstetrics & Gynecology

## 2023-09-19 ENCOUNTER — Other Ambulatory Visit (HOSPITAL_BASED_OUTPATIENT_CLINIC_OR_DEPARTMENT_OTHER): Payer: Self-pay | Admitting: Certified Nurse Midwife

## 2023-09-19 DIAGNOSIS — Z79890 Hormone replacement therapy (postmenopausal): Secondary | ICD-10-CM

## 2023-09-19 DIAGNOSIS — Z9229 Personal history of other drug therapy: Secondary | ICD-10-CM

## 2023-09-19 MED ORDER — ESTRADIOL-NORETHINDRONE ACET 0.5-0.1 MG PO TABS: ORAL_TABLET | ORAL | 2 refills | Status: DC

## 2023-10-21 ENCOUNTER — Encounter: Payer: Self-pay | Admitting: Family

## 2023-10-21 ENCOUNTER — Ambulatory Visit: Admitting: Family

## 2023-10-21 VITALS — BP 102/70 | HR 77 | Temp 97.9°F | Ht 65.0 in | Wt 138.1 lb

## 2023-10-21 DIAGNOSIS — G43119 Migraine with aura, intractable, without status migrainosus: Secondary | ICD-10-CM | POA: Diagnosis not present

## 2023-10-21 DIAGNOSIS — R197 Diarrhea, unspecified: Secondary | ICD-10-CM | POA: Diagnosis not present

## 2023-10-21 DIAGNOSIS — R198 Other specified symptoms and signs involving the digestive system and abdomen: Secondary | ICD-10-CM | POA: Diagnosis not present

## 2023-10-21 MED ORDER — SUMATRIPTAN SUCCINATE 100 MG PO TABS
ORAL_TABLET | ORAL | 5 refills | Status: DC
Start: 2023-10-21 — End: 2024-01-02

## 2023-10-21 NOTE — Assessment & Plan Note (Addendum)
 Discussed fod map diet.  Trial without dairy and or gluten to see if food intolerance.  Stool tests today to r/o infectious process.  Cologuard negative and up to date 2023 however could consider referral to colonoscopy if ongoing.

## 2023-10-21 NOTE — Patient Instructions (Signed)
  Trial elimination diet.  Stop gluten x 2 weeks and see if improvement if none add this back in.  Do the same with dairy x 2 weeks to see if improvement.

## 2023-10-21 NOTE — Progress Notes (Signed)
 Established Patient Office Visit  Subjective:   Patient ID: Gloria Rivera, female    DOB: [birth year]  Age: 63 y.o. MRN: [id number]  CC:  Chief Complaint  Patient presents with   Diarrhea        Gas   Abdominal Pain    After eating    HPI: Gloria Rivera is a 63 y.o. female presenting on 10/21/2023 for Diarrhea (/), Gas, and Abdominal Pain (After eating)  Suspects she might have IBS.  She has been dealing with symptoms over 6 months of alternating diarrhea and or constipation and or gas. She states this is a on a regular constant basis. Quite often 'tummy rumbling'. When she does have diarrhea she can up to six episodes in one day. She will then take an immodium and then will be constipated. She notices after she eats she will get 'gurly' and she wonders if she has lactose intolerance. She wonders though because she eats ice cream and also milk with dinner, will still have a little 'tummy ache' like normal.  No blood or mucous seen in the stool.   She doesn't seem to notice an issue with beef or pork.  Doesn't burp often. doesn't have heart burn.  Cologuard Aug 16 2021 never had a colonoscopy      ROS: Negative unless specifically indicated above in HPI.   Relevant past medical history reviewed and updated as indicated.   Allergies and medications reviewed and updated.   Current Outpatient Medications:    albuterol  (VENTOLIN  HFA) 108 (90 Base) MCG/ACT inhaler, Inhale 2 puffs into the lungs every 6 (six) hours as needed for wheezing or shortness of breath., Disp: , Rfl:    Calcium Carbonate (CALCIUM 600 PO), Take 1 tablet by mouth daily., Disp: , Rfl:    celecoxib  (CELEBREX ) 200 MG capsule, TAKE 1 TO 2 CAPSULES BY MOUTH DAILY AS NEEDED FOR PAIN., Disp: 180 capsule, Rfl: 1   EPINEPHrine  0.3 mg/0.3 mL IJ SOAJ injection, Inject 0.3 mg into the muscle as needed for anaphylaxis., Disp: 1 each, Rfl: 1   Estradiol -Norethindrone  Acet 0.5-0.1 MG tablet, 1/2 tab daily, Disp: 45  tablet, Rfl: 2   risedronate  (ACTONEL ) 150 MG tablet, Take 1 tablet (150 mg total) by mouth every 30 (thirty) days. with water on empty stomach, nothing by mouth or lie down for next 30 minutes., Disp: 3 tablet, Rfl: 3   topiramate  (TOPAMAX ) 25 MG tablet, TAKE 1 TABLET BY MOUTH TWICE A DAY, Disp: 180 tablet, Rfl: 1   venlafaxine  XR (EFFEXOR -XR) 150 MG 24 hr capsule, Take 1 capsule (150 mg total) by mouth daily with breakfast., Disp: 90 capsule, Rfl: 3   VITAMIN D  PO, Take 1 capsule by mouth daily., Disp: , Rfl:    SUMAtriptan  (IMITREX ) 100 MG tablet, Take one tablet at onset of headache, repeat in two hours if needed for max of 200 mg daily, Disp: 10 tablet, Rfl: 5  Allergies  Allergen Reactions   Bee Venom     Objective:   BP 102/70   Pulse 77   Temp 97.9 F (36.6 C) (Oral)   Ht 5' 5 (1.651 m)   Wt 138 lb 2 oz (62.7 kg)   SpO2 99%   BMI 22.99 kg/m    Physical Exam Constitutional:      General: She is not in acute distress.    Appearance: Normal appearance. She is normal weight. She is not ill-appearing, toxic-appearing or diaphoretic.  HENT:     Head:  Normocephalic.  Cardiovascular:     Rate and Rhythm: Normal rate and regular rhythm.  Pulmonary:     Effort: Pulmonary effort is normal.     Breath sounds: Normal breath sounds.  Abdominal:     General: Bowel sounds are normal.     Tenderness: There is no abdominal tenderness.  Musculoskeletal:        General: Normal range of motion.  Neurological:     General: No focal deficit present.     Mental Status: She is alert and oriented to person, place, and time. Mental status is at baseline.  Psychiatric:        Mood and Affect: Mood normal.        Behavior: Behavior normal.        Thought Content: Thought content normal.        Judgment: Judgment normal.     Assessment & Plan:  Diarrhea of presumed infectious origin  Intractable migraine with aura without status migrainosus -     SUMAtriptan  Succinate; Take one  tablet at onset of headache, repeat in two hours if needed for max of 200 mg daily  Dispense: 10 tablet; Refill: 5  Alternating constipation and diarrhea Assessment & Plan: Discussed fod map diet.  Trial without dairy and or gluten to see if food intolerance.  Stool tests today to r/o infectious process.  Cologuard negative and up to date 2023 however could consider referral to colonoscopy if ongoing.    Orders: -     Giardia antigen -     C. difficile GDH and Toxin A/B -     Gastrointestinal Pathogen Pnl RT, PCR     Follow up plan: Return in about 6 months (around 04/22/2024), or if symptoms worsen or fail to improve, for f/u CPE.  Gloria Patrick, FNP

## 2023-10-21 NOTE — Addendum Note (Signed)
 Addended by: LORELLE ROCKY BRAVO on: 10/21/2023 11:32 AM   Modules accepted: Orders

## 2023-10-24 ENCOUNTER — Other Ambulatory Visit: Payer: Self-pay

## 2023-10-24 DIAGNOSIS — R198 Other specified symptoms and signs involving the digestive system and abdomen: Secondary | ICD-10-CM

## 2023-10-25 ENCOUNTER — Ambulatory Visit: Admitting: Family

## 2023-10-25 ENCOUNTER — Ambulatory Visit: Payer: Self-pay | Admitting: Family

## 2023-10-25 LAB — C. DIFFICILE GDH AND TOXIN A/B
GDH ANTIGEN: NOT DETECTED
MICRO NUMBER:: 16753489
SPECIMEN QUALITY:: ADEQUATE
TOXIN A AND B: NOT DETECTED

## 2023-10-26 LAB — GASTROINTESTINAL PATHOGEN PNL
CampyloBacter Group: NOT DETECTED
Norovirus GI/GII: NOT DETECTED
Rotavirus A: NOT DETECTED
Salmonella species: NOT DETECTED
Shiga Toxin 1: NOT DETECTED
Shiga Toxin 2: NOT DETECTED
Shigella Species: NOT DETECTED
Vibrio Group: NOT DETECTED
Yersinia enterocolitica: NOT DETECTED

## 2023-10-26 LAB — GIARDIA ANTIGEN
MICRO NUMBER:: 16753447
RESULT:: NOT DETECTED
SPECIMEN QUALITY:: ADEQUATE

## 2023-11-17 ENCOUNTER — Encounter: Payer: Self-pay | Admitting: Family

## 2023-11-29 ENCOUNTER — Encounter: Payer: Self-pay | Admitting: Sports Medicine

## 2023-12-28 ENCOUNTER — Telehealth (HOSPITAL_BASED_OUTPATIENT_CLINIC_OR_DEPARTMENT_OTHER): Payer: Self-pay

## 2023-12-28 NOTE — Telephone Encounter (Signed)
 Checked DPR and patient will except detailed messages on the listed number. LMOM at 1:53 that I received her message concerning the osteoporosis medication. Per Dr. Cleotilde it will be ok for her to stop osteoporosis medication, however she would like for patient to give an update in 2-3 months. Advised patient to call office with any additional concerns or questions. tbw

## 2023-12-28 NOTE — Telephone Encounter (Signed)
 Patient called and left a message today in regards to osteoporosis medication. Patient states that she is going to stop taking this medication for a couple of months. She has been experiencing a lot of diarrhea which is listed as a side effect. She has been worked up of GI symptoms by her PCP and everything has come back ok. If Dr. Cleotilde feels like she should do something different please give her a call back. tbw

## 2024-01-01 ENCOUNTER — Other Ambulatory Visit: Payer: Self-pay | Admitting: Family

## 2024-01-01 DIAGNOSIS — G43119 Migraine with aura, intractable, without status migrainosus: Secondary | ICD-10-CM

## 2024-01-10 ENCOUNTER — Encounter: Payer: Self-pay | Admitting: Family

## 2024-01-11 NOTE — Telephone Encounter (Signed)
 Can we look into this?  Does she need to bring you all the bill? I am happy to try to re code

## 2024-01-16 NOTE — Telephone Encounter (Signed)
 Lab can you let Gloria Rivera know which test in regards to the bill she brought in?

## 2024-01-18 NOTE — Telephone Encounter (Signed)
 Zell is in outbox.

## 2024-02-12 ENCOUNTER — Encounter (HOSPITAL_BASED_OUTPATIENT_CLINIC_OR_DEPARTMENT_OTHER): Payer: Self-pay | Admitting: Obstetrics & Gynecology

## 2024-02-13 ENCOUNTER — Ambulatory Visit: Payer: Self-pay | Admitting: Family

## 2024-02-13 ENCOUNTER — Ambulatory Visit: Admitting: Family

## 2024-02-13 ENCOUNTER — Ambulatory Visit (INDEPENDENT_AMBULATORY_CARE_PROVIDER_SITE_OTHER)
Admission: RE | Admit: 2024-02-13 | Discharge: 2024-02-13 | Disposition: A | Source: Ambulatory Visit | Attending: Family

## 2024-02-13 ENCOUNTER — Encounter: Payer: Self-pay | Admitting: Family

## 2024-02-13 VITALS — BP 118/74 | HR 80 | Temp 98.0°F | Ht 65.0 in | Wt 138.4 lb

## 2024-02-13 DIAGNOSIS — J4540 Moderate persistent asthma, uncomplicated: Secondary | ICD-10-CM | POA: Diagnosis not present

## 2024-02-13 DIAGNOSIS — M7072 Other bursitis of hip, left hip: Secondary | ICD-10-CM

## 2024-02-13 DIAGNOSIS — L03031 Cellulitis of right toe: Secondary | ICD-10-CM | POA: Diagnosis not present

## 2024-02-13 DIAGNOSIS — M25552 Pain in left hip: Secondary | ICD-10-CM

## 2024-02-13 DIAGNOSIS — M19041 Primary osteoarthritis, right hand: Secondary | ICD-10-CM | POA: Diagnosis not present

## 2024-02-13 DIAGNOSIS — M19042 Primary osteoarthritis, left hand: Secondary | ICD-10-CM

## 2024-02-13 MED ORDER — CEPHALEXIN 500 MG PO CAPS: 500.0000 mg | ORAL_CAPSULE | Freq: Three times a day (TID) | ORAL | 0 refills | Status: AC

## 2024-02-13 MED ORDER — FLUTICASONE PROPIONATE HFA 44 MCG/ACT IN AERO: 2.0000 | INHALATION_SPRAY | Freq: Two times a day (BID) | RESPIRATORY_TRACT | 12 refills | Status: AC

## 2024-02-13 MED ORDER — PREDNISONE 10 MG (21) PO TBPK: ORAL_TABLET | ORAL | 0 refills | Status: DC

## 2024-02-13 MED ORDER — DICLOFENAC SODIUM 75 MG PO TBEC: 75.0000 mg | DELAYED_RELEASE_TABLET | Freq: Two times a day (BID) | ORAL | 0 refills | Status: DC

## 2024-02-13 NOTE — Progress Notes (Unsigned)
 Established Patient Office Visit  Subjective:      CC:  Chief Complaint  Patient presents with   Hip Pain    L hip, onset was several years ago but recently flared up.    HPI: Gloria Rivera is a 63 y.o. female presenting on 02/13/2024 for Hip Pain (L hip, onset was several years ago but recently flared up.) .  Discussed the use of AI scribe software for clinical note transcription with the patient, who gave verbal consent to proceed.  History of Present Illness Gloria Rivera is a 63 year old female with hip bursitis who presents with worsening left hip pain.  She has been experiencing ongoing issues with left hip bursitis, initially noted eleven years ago and again five years ago. Over the past two months, the pain has intensified, particularly when walking downhill and lying on her left side. The pain is more severe at night and is also present, though less intense, in the right hip.  She has tried various treatments including heat, cold, and sleeping on a donut pillow without relief. Currently, she takes Celebrex  twice daily but feels it may no longer be effective. A left hip x-ray in 2020 reportedly did not show any acute findings, and bone density tests in October 2024 were discussed with her GYN, who noted changes in her bone density. Prednisone  treatment in the past improved her symptoms after a double course.  She has a history of osteoporosis and was on Actonel  for three months, which caused gastrointestinal symptoms including diarrhea. After discontinuing Actonel , these symptoms resolved. She has kept a food diary to rule out dietary causes for her symptoms, finding no correlation.  She experiences a persistent lung issue since having COVID-19 in 2022, characterized by a need to clear her throat once or twice a week. Relief is found with a specific medication, which she uses sparingly.  She reports an infection around the nail bed of her right great toe, described as  red and swollen. No shortness of breath or other systemic symptoms are present.  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         Social history:  Relevant past medical, surgical, family and social history reviewed and updated as indicated. Interim medical history since our last visit reviewed.  Allergies and medications reviewed and updated.  DATA REVIEWED: CHART IN EPIC     ROS: Negative unless specifically indicated above in HPI.    Current Outpatient Medications:    albuterol  (VENTOLIN  HFA) 108 (90 Base) MCG/ACT inhaler, Inhale 2 puffs into the lungs every 6 (six) hours as needed for wheezing or shortness of breath., Disp: , Rfl:    Calcium Carbonate (CALCIUM 600 PO), Take 1 tablet by mouth daily., Disp: , Rfl:    cephALEXin (KEFLEX) 500 MG capsule, Take 1 capsule (500 mg total) by mouth 3 (three) times daily for 7 days., Disp: 21 capsule, Rfl: 0   diclofenac (VOLTAREN) 75 MG EC tablet, Take 1 tablet (75 mg total) by mouth 2 (two) times daily., Disp: 60 tablet, Rfl: 0   EPINEPHrine  0.3 mg/0.3 mL IJ SOAJ injection, Inject 0.3 mg into the muscle as needed for anaphylaxis., Disp: 1 each, Rfl: 1   Estradiol -Norethindrone  Acet 0.5-0.1 MG tablet, 1/2 tab daily, Disp: 45 tablet, Rfl: 2   fluticasone  (FLOVENT  HFA) 44 MCG/ACT inhaler, Inhale 2 puffs into the lungs in the morning and at bedtime., Disp: 1 each, Rfl:  12   predniSONE  (STERAPRED UNI-PAK 21 TAB) 10 MG (21) TBPK tablet, Take as directed, Disp: 1 each, Rfl: 0   SUMAtriptan  (IMITREX ) 100 MG tablet, TAKE ONE TABLET AT ONSET OF HEADACHE, REPEAT IN TWO HOURS IF NEEDED FOR MAX OF 200 MG DAILY, Disp: 36 tablet, Rfl: 1   topiramate  (TOPAMAX ) 25 MG tablet, TAKE 1 TABLET BY MOUTH TWICE A DAY, Disp: 180 tablet, Rfl: 1   venlafaxine  XR (EFFEXOR -XR) 150 MG 24 hr capsule, Take 1 capsule (150 mg total) by mouth daily with breakfast., Disp: 90 capsule, Rfl: 3   VITAMIN  D PO, Take 1 capsule by mouth daily., Disp: , Rfl:         Objective:        BP 118/74 (BP Location: Left Arm, Patient Position: Sitting, Cuff Size: Normal)   Pulse 80   Temp 98 F (36.7 C) (Temporal)   Ht 5' 5 (1.651 m)   Wt 138 lb 6.4 oz (62.8 kg)   SpO2 96%   BMI 23.03 kg/m   Physical Exam MUSCULOSKELETAL: Tenderness over left hip IT band. Normal range of motion in left hip.  Wt Readings from Last 3 Encounters:  02/13/24 138 lb 6.4 oz (62.8 kg)  10/21/23 138 lb 2 oz (62.7 kg)  08/11/23 138 lb (62.6 kg)    Physical Exam Vitals reviewed.  Constitutional:      General: She is not in acute distress.    Appearance: Normal appearance. She is normal weight. She is not ill-appearing, toxic-appearing or diaphoretic.  HENT:     Head: Normocephalic.  Cardiovascular:     Rate and Rhythm: Normal rate.  Pulmonary:     Effort: Pulmonary effort is normal.  Musculoskeletal:        General: Normal range of motion.     Right lower leg: Tenderness present.  Feet:     Right foot:     Skin integrity: Erythema and warmth present.  Neurological:     General: No focal deficit present.     Mental Status: She is alert and oriented to person, place, and time. Mental status is at baseline.  Psychiatric:        Mood and Affect: Mood normal.        Behavior: Behavior normal.        Thought Content: Thought content normal.        Judgment: Judgment normal.           Results RADIOLOGY Left Hip X-ray: No acute findings (2020)  DIAGNOSTIC Bone Density: Significant osteoporosis in the AP spine and left hip, osteopenia (01/06/2023)  Assessment & Plan:   Assessment and Plan Assessment & Plan Left hip bursitis and iliotibial band syndrome Chronic left hip bursitis with recent exacerbation over the past two months, causing pain during downhill walking and inability to sleep on the left side. Previous x-ray in 2020 showed no acute findings but significant osteoporosis and  osteopenia. Current treatment with Celebrex  is ineffective. Differential includes arthritis progression. - Prescribed prednisone  taper for acute flare. - Discontinued Celebrex  - switching to diclofenac 75 mg twice daily after prednisone  taper. - Ordered hip x-ray to assess for changes since 2020. - Provided stretches for iliotibial band syndrome. - Advised heat application and gentle massage for IT band.  Right great toe cellulitis Acute cellulitis of the right great toe, likely periapical infection around the nail bed, with redness and swelling. - Prescribed cephalexin for infection. - Advised warm soaks for the toe.  Primary osteoarthritis  of right hand Chronic osteoarthritis in the right hand with recent exacerbation causing pain in the baby finger and thumb, affecting daily activities such as crocheting. - switching from Celebrex  to diclofenac for pain management.  Osteoporosis and osteopenia Significant osteoporosis in the AP spine and left hip with osteopenia. Previous treatment with Actonel  was discontinued due to gastrointestinal side effects. Awaiting Medicare coverage for repeat bone density scan in January. - Await Medicare coverage for repeat bone density scan in January. - Discuss alternative osteoporosis medications with gynecologist.  Moderate persistent asthma, post-COVID Moderate persistent asthma symptoms post-COVID, including throat clearing and occasional use of Albuterol  inhaler. No shortness of breath or cardiac issues reported. - Advised use of inhaler as needed for throat clearing.      Return if symptoms worsen or fail to improve.     [name], MSN, APRN, FNP-C [facility]

## 2024-02-14 MED ORDER — FLUCONAZOLE 150 MG PO TABS: ORAL_TABLET | ORAL | 0 refills | Status: DC

## 2024-02-15 ENCOUNTER — Other Ambulatory Visit (HOSPITAL_BASED_OUTPATIENT_CLINIC_OR_DEPARTMENT_OTHER): Payer: Self-pay | Admitting: Obstetrics & Gynecology

## 2024-02-15 DIAGNOSIS — T887XXA Unspecified adverse effect of drug or medicament, initial encounter: Secondary | ICD-10-CM

## 2024-02-15 DIAGNOSIS — M810 Age-related osteoporosis without current pathological fracture: Secondary | ICD-10-CM

## 2024-02-28 MED ORDER — CELECOXIB 200 MG PO CAPS: 200.0000 mg | ORAL_CAPSULE | Freq: Two times a day (BID) | ORAL | 2 refills | Status: AC

## 2024-02-28 NOTE — Addendum Note (Signed)
 Addended by: CORWIN ANTU on: 02/28/2024 03:03 PM   Modules accepted: Orders

## 2024-03-02 ENCOUNTER — Other Ambulatory Visit: Payer: Self-pay | Admitting: Family

## 2024-03-02 DIAGNOSIS — M19041 Primary osteoarthritis, right hand: Secondary | ICD-10-CM

## 2024-03-05 ENCOUNTER — Other Ambulatory Visit: Payer: Self-pay | Admitting: Obstetrics & Gynecology

## 2024-03-05 DIAGNOSIS — Z1231 Encounter for screening mammogram for malignant neoplasm of breast: Secondary | ICD-10-CM

## 2024-03-06 MED ORDER — PREDNISONE 10 MG (21) PO TBPK: ORAL_TABLET | ORAL | 0 refills | Status: DC

## 2024-03-06 NOTE — Addendum Note (Signed)
 Addended by: CORWIN ANTU on: 03/06/2024 12:34 PM   Modules accepted: Orders

## 2024-03-13 ENCOUNTER — Other Ambulatory Visit: Payer: Self-pay | Admitting: Family

## 2024-03-13 DIAGNOSIS — G43119 Migraine with aura, intractable, without status migrainosus: Secondary | ICD-10-CM

## 2024-03-16 ENCOUNTER — Encounter: Payer: Self-pay | Admitting: Emergency Medicine

## 2024-03-16 ENCOUNTER — Ambulatory Visit
Admission: EM | Admit: 2024-03-16 | Discharge: 2024-03-16 | Disposition: A | Discharge status: Home / Self Care | Attending: Emergency Medicine | Admitting: Emergency Medicine

## 2024-03-16 ENCOUNTER — Ambulatory Visit: Payer: Self-pay

## 2024-03-16 DIAGNOSIS — J101 Influenza due to other identified influenza virus with other respiratory manifestations: Secondary | ICD-10-CM

## 2024-03-16 DIAGNOSIS — B349 Viral infection, unspecified: Secondary | ICD-10-CM

## 2024-03-16 LAB — POC COVID19/FLU A&B COMBO
Covid Antigen, POC: NEGATIVE
Influenza A Antigen, POC: POSITIVE — AB
Influenza B Antigen, POC: NEGATIVE

## 2024-03-16 LAB — POCT RAPID STREP A (OFFICE): Rapid Strep A Screen: NEGATIVE

## 2024-03-16 MED ORDER — OSELTAMIVIR PHOSPHATE 75 MG PO CAPS: 75.0000 mg | ORAL_CAPSULE | Freq: Two times a day (BID) | ORAL | 0 refills | Status: DC

## 2024-03-16 NOTE — Telephone Encounter (Signed)
" ° °  FYI Only or Action Required?: Action required by provider: request for appointment and update on patient condition.   Patient was last seen in primary care on 02/13/2024 by Corwin Antu, FNP.  Called Nurse Triage reporting Sore Throat.  Symptoms began yesterday.  Interventions attempted: OTC medications: Mucinex, Tylenol , Advil.  Symptoms are: unchanged.  Triage Disposition: See Physician Within 24 Hours  Patient/caregiver understands and will follow disposition?: Yes  Message from [facility] sent at 03/16/2024  8:53 AM EST  Reason for Triage: Flu test and strep throat test.. she has fever ( 101 ), headache, coughing up mucus ( she doesn't want to look at it -doesn't know the color ), body aches, sore throat , stuffy nose   Reason for Disposition  SEVERE throat pain (e.g., excruciating)  Answer Assessment - Initial Assessment Questions 1. ONSET: When did the throat start hurting? (Hours or days ago)       yesterday  2. SEVERITY: How bad is the sore throat? (Scale 1-10; mild, moderate or severe)     6/10  3. STREP EXPOSURE: Has there been any exposure to strep within the past week? If Yes, ask: What type of contact occurred?  Yes, was exposed  4.  VIRAL SYMPTOMS: Pt denies hoarse voice or red eyes?  Cough, runny nose, eye burning  5. FEVER: Do you have a fever? If Yes, ask: What is your temperature, how was it measured, and when did it start?      100.0 F after treatment  6. PUS ON THE TONSILS: Is there pus on the tonsils in the back of your throat?      Denies  7. OTHER SYMPTOMS: Pt denies difficulty breathing, rash      Headache  Pt reports sore throat, cough, fever, headache Pt is taking OTC Mucinex, Tylenol , Advil Unable to schedule appt for pt do to lack of availability.  Pt requesting to be see by PCP and does not want to go to [facility]. Routing to clinic to assist with scheduling an appt for pt preferably same day if possible. Pt agrees with plan  of care, will call back for any worsening symptoms  Protocols used: Sore Throat-A-AH  "

## 2024-03-16 NOTE — Telephone Encounter (Signed)
 Spoke with pt. Advised her that we do not have any available appts here at [facility] today. I advised her that she definitely needs to be seen some where. States that she will go to an urgent care today.

## 2024-03-16 NOTE — Discharge Instructions (Addendum)
 Influenza A is a virus and should steadily improve in time it can take up to 7 to 10 days before you truly start to see a turnaround however things will get better  Begin Tamiflu  every morning and every evening for 5 days to reduce the amount of virus in the body which helps to minimize symptoms  Will need to quarantine if having fever for 24 hours, if no fever may continue activity wearing mask for 5 days  from the start of your symptoms    You can take Tylenol  and/or Ibuprofen as needed for fever reduction and pain relief.   For cough: honey 1/2 to 1 teaspoon (you can dilute the honey in water or another fluid).  You can also use guaifenesin and dextromethorphan for cough. You can use a humidifier for chest congestion and cough.  If you don't have a humidifier, you can sit in the bathroom with the hot shower running.      For sore throat: try warm salt water gargles, cepacol lozenges, throat spray, warm tea or water with lemon/honey, popsicles or ice, or OTC cold relief medicine for throat discomfort.   For congestion: take a daily anti-histamine like Zyrtec, Claritin, and a oral decongestant, such as pseudoephedrine.  You can also use Flonase  1-2 sprays in each nostril daily.   It is important to stay hydrated: drink plenty of fluids (water, gatorade/powerade/pedialyte, juices, or teas) to keep your throat moisturized and help further relieve irritation/discomfort.

## 2024-03-16 NOTE — ED Provider Notes (Signed)
 " CAY RALPH PELT    CSN: [id number] Arrival date & time: 03/16/24  1122      History   Chief Complaint Chief Complaint  Patient presents with   Otalgia   Headache   Cough   Sore Throat   Generalized Body Aches   Nasal Congestion   Fever    HPI Gloria Rivera is a 63 y.o. female.   Patient presents for evaluation of a fever peaking at 101.6,, chills, generalized bodyaches, nasal congestion, bilateral ear pain and fullness, sore throat, nonproductive cough and intermittent headaches beginning 1 day ago.  Known sick contacts.  Has attempted Tylenol  Mucinex and Advil.     Past Medical History:  Diagnosis Date   Adult acne    Anxiety    Arthritis    both hands   Fracture of right ankle    Seizure (HCC)    Stomach ulcer     Patient Active Problem List   Diagnosis Date Noted   Alternating constipation and diarrhea 10/21/2023   Age-related osteoporosis without current pathological fracture 05/19/2023   Prediabetes 05/19/2023   Low serum vitamin B12 05/19/2023   Bursitis of hip 05/19/2023   Elevated platelet count 12/06/2022   Low calcium levels 12/06/2022   Interstitial cystitis 12/06/2022   Allergy to honey bee venom 12/06/2022   Age-related cataract of left eye 12/06/2022   History of second hand smoke exposure 12/06/2022   Neuralgia of lower extremity 12/06/2022   Squamous cell cancer of skin of right cheek 12/06/2022   Reactive airway disease 12/06/2022   Osteopenia of lumbar spine 08/10/2022   Hormone replacement therapy (HRT) 08/10/2022   Migraine 07/19/2022   Anxiety    Primary osteoarthritis of both hands 07/20/2019   Menopausal symptom 09/18/2014    Past Surgical History:  Procedure Laterality Date   ABLATION  11/2006   ANKLE FRACTURE SURGERY Right 12/2006   with rods in place   BREAST LUMPECTOMY  1997   Benign   CATARACT EXTRACTION Right 10/2020   with lens replacement   JOINT REPLACEMENT     KNEE ARTHROSCOPY Left 1995   Osteoma  Removal  12/2012   forehead x 3   TONSILLECTOMY     VITRECTOMY Right 03/12/2021   done at [facility]    OB History     Gravida  3   Para  3   Term  3   Preterm      AB      Living  3      SAB      IAB      Ectopic      Multiple      Live Births  3            Home Medications    Prior to Admission medications  Medication Sig Start Date End Date Taking? Authorizing Provider  albuterol  (VENTOLIN  HFA) 108 (90 Base) MCG/ACT inhaler Inhale 2 puffs into the lungs every 6 (six) hours as needed for wheezing or shortness of breath. 12/06/22   Dugal, Tabitha, FNP  Calcium Carbonate (CALCIUM 600 PO) Take 1 tablet by mouth daily.    [provider]  celecoxib  (CELEBREX ) 200 MG capsule Take 1 capsule (200 mg total) by mouth 2 (two) times daily. 02/28/24   Corwin Antu, FNP  EPINEPHrine  0.3 mg/0.3 mL IJ SOAJ injection Inject 0.3 mg into the muscle as needed for anaphylaxis. 12/06/22   Dugal, Tabitha, FNP  Estradiol -Norethindrone  Acet 0.5-0.1 MG tablet 1/2 tab daily  09/19/23   Tad Arland POUR, CNM  fluconazole  (DIFLUCAN ) 150 MG tablet Take one po every day for one dose repeat in three days if still with symptoms 02/14/24   Corwin Antu, FNP  fluticasone  (FLOVENT  HFA) 44 MCG/ACT inhaler Inhale 2 puffs into the lungs in the morning and at bedtime. 02/13/24   Corwin Antu, FNP  predniSONE  (STERAPRED UNI-PAK 21 TAB) 10 MG (21) TBPK tablet Take as directed 03/06/24   Corwin Antu, FNP  SUMAtriptan  (IMITREX ) 100 MG tablet TAKE ONE TABLET AT ONSET OF HEADACHE, REPEAT IN TWO HOURS IF NEEDED FOR MAX OF 200 MG DAILY 01/02/24   Corwin Antu, FNP  topiramate  (TOPAMAX ) 25 MG tablet TAKE 1 TABLET BY MOUTH TWICE A DAY 03/13/24   Corwin Antu, FNP  venlafaxine  XR (EFFEXOR -XR) 150 MG 24 hr capsule Take 1 capsule (150 mg total) by mouth daily with breakfast. 08/11/23   Cleotilde Ronal RAMAN, MD  VITAMIN D  PO Take 1 capsule by mouth daily.    [provider]    Family History Family  History  Problem Relation Age of Onset   Obesity Mother        smoker   COPD Father        smoker   Diabetes Sister    Kidney failure Sister    Breast cancer Maternal Grandmother 100   Heart attack Maternal Uncle     Social History Social History[1]   Allergies   Bee venom   Review of Systems Review of Systems  Constitutional:  Positive for fever. Negative for activity change, appetite change, chills, diaphoresis, fatigue and unexpected weight change.  HENT:  Positive for congestion, ear pain and sore throat. Negative for dental problem, drooling, ear discharge, facial swelling, hearing loss, mouth sores, nosebleeds, postnasal drip, rhinorrhea, sinus pressure, sinus pain, sneezing, tinnitus, trouble swallowing and voice change.   Respiratory:  Positive for cough. Negative for apnea, choking, chest tightness, shortness of breath, wheezing and stridor.   Gastrointestinal: Negative.   Musculoskeletal:  Positive for myalgias. Negative for arthralgias, back pain, gait problem, joint swelling, neck pain and neck stiffness.  Neurological:  Positive for headaches. Negative for dizziness, tremors, seizures, syncope, facial asymmetry, speech difficulty, weakness, light-headedness and numbness.     Physical Exam Triage Vital Signs ED Triage Vitals  Encounter Vitals Group     BP 03/16/24 1217 105/69     Girls Systolic BP Percentile --      Girls Diastolic BP Percentile --      Boys Systolic BP Percentile --      Boys Diastolic BP Percentile --      Pulse Rate 03/16/24 1217 82     Resp 03/16/24 1217 20     Temp 03/16/24 1217 98.4 F (36.9 C)     Temp Source 03/16/24 1217 Oral     SpO2 03/16/24 1217 95 %     Weight --      Height --      Head Circumference --      Peak Flow --      Pain Score 03/16/24 1221 5     Pain Loc --      Pain Education --      Exclude from Growth Chart --    No data found.  Updated Vital Signs BP 105/69 (BP Location: Left Arm)   Pulse 82   Temp  98.4 F (36.9 C) (Oral)   Resp 20   SpO2 95%   Visual Acuity Right Eye Distance:  Left Eye Distance:   Bilateral Distance:    Right Eye Near:   Left Eye Near:    Bilateral Near:     Physical Exam Constitutional:      Appearance: Normal appearance.  HENT:     Right Ear: Tympanic membrane, ear canal and external ear normal.     Left Ear: Tympanic membrane, ear canal and external ear normal.     Nose: Congestion present.     Mouth/Throat:     Pharynx: No oropharyngeal exudate or posterior oropharyngeal erythema.  Cardiovascular:     Rate and Rhythm: Normal rate and regular rhythm.     Pulses: Normal pulses.     Heart sounds: Normal heart sounds.  Pulmonary:     Effort: Pulmonary effort is normal.     Breath sounds: Normal breath sounds.  Musculoskeletal:     Cervical back: Normal range of motion and neck supple.  Neurological:     Mental Status: She is alert and oriented to person, place, and time. Mental status is at baseline.      UC Treatments / Results  Labs (all labs ordered are listed, but only abnormal results are displayed) Labs Reviewed  POC COVID19/FLU A&B COMBO  POCT RAPID STREP A (OFFICE)    EKG   Radiology No results found.  Procedures Procedures (including critical care time)  Medications Ordered in UC Medications - No data to display  Initial Impression / Assessment and Plan / UC Course  I have reviewed the triage vital signs and the nursing notes.  Pertinent labs & imaging results that were available during my care of the patient were reviewed by me and considered in my medical decision making (see chart for details).  Influenza A, viral illness  Patient is in no signs of distress nor toxic appearing.  Vital signs are stable.  Low suspicion for pneumonia, pneumothorax or bronchitis and therefore will defer imaging.  Prescribed Tamiflu.  Discussed quarantining if experiencing fever.May use additional over-the-counter medications as needed  for supportive care.  May follow-up with urgent care as needed if symptoms persist or worsen.  Final Clinical Impressions(s) / UC Diagnoses   Final diagnoses:  Viral illness   Discharge Instructions   None    ED Prescriptions   None    PDMP not reviewed this encounter.     [1]  Social History Tobacco Use   Smoking status: Never    Passive exposure: Never   Smokeless tobacco: Never  Vaping Use   Vaping status: Never Used  Substance Use Topics   Alcohol use: Yes    Alcohol/week: 2.0 standard drinks of alcohol    Types: 2 Glasses of wine per week    Comment: beer/wine/liquor twice a month   Drug use: Never     Teresa Shelba SAUNDERS, NP 03/16/24 1641  "

## 2024-03-16 NOTE — ED Triage Notes (Signed)
 Patient reports body aches, headache, cough, runny nose, right ear pain and sore throat x 1 day. Patient also reports a fever 101.6 and she took Tylenol  for fever. Patient has also taken muicnex with mild relief. Rates body aches 5/10, headache 7/10, ear pain 4/10 and sore throat 8/10.

## 2024-03-26 ENCOUNTER — Ambulatory Visit: Payer: Self-pay

## 2024-03-26 NOTE — Telephone Encounter (Signed)
 FYI Only or Action Required?: Action required by provider: appointment booked outside dispo patient declined UC .  Patient was last seen in primary care on 02/13/2024 by Corwin Antu, FNP.  Called Nurse Triage reporting Cough.  Symptoms began a week ago.  Interventions attempted: Rest, hydration, or home remedies.  Symptoms are: stable.  Triage Disposition: See HCP Within 4 Hours (Or PCP Triage)  Patient/caregiver understands and will follow disposition?: No, wishes to speak with PCP- requested to book next opening at office this week         Copied from CRM #[id number]. Topic: Clinical - Red Word Triage >> Mar 26, 2024  2:03 PM Frederich PARAS wrote: [facility] that prompted transfer to Nurse Triage: extreme fatigue , possible bronchitis  pt needs lingering chest and head congestion, may be bronchitis, coughing up mucus, tired very tired. Reason for Disposition  Wheezing is present  Answer Assessment - Initial Assessment Questions Patient reports had the flu , and having lingering chest congestion and head congestion. Coughing up gross stuff hasn't looked . In the past goes into bronchitis. States really just wont go away form nose mucus its brown. Can take deep breaths sometimes causes cough not always. Intermittent wheezing. Has been very tired. Has reactions to cold medicine isnt taking OTC. Worried will go into bronchitis. No appointments today/tomorrow. Patient wants office appointment in person no openings , wants next opening at office only. Booked appointment per pt request will route to [facility] to review as this was booked outside disposition. Pt does prefer to see PCP not able to find opening this week .    1. ONSET: When did the cough begin?      About 1 week ago has been lingering since flu symptoms seen UC 03/16/2024 positive flu A, was given tamiflu  had to stop (got 2 doses)  2. SEVERITY: How bad is the cough today?      Moderate in wont go away  3. SPUTUM: Describe  the color of your sputum (e.g., none, dry cough; clear, white, yellow, green)     Is colored hasn't looked at it   4. HEMOPTYSIS: Are you coughing up any blood? If Yes, ask: How much? (e.g., flecks, streaks, tablespoons, etc.)     Denies  5. DIFFICULTY BREATHING: Are you having difficulty breathing? If Yes, ask: How bad is it? (e.g., mild, moderate, severe)      Denies  6. FEVER: Do you have a fever? If Yes, ask: What is your temperature, how was it measured, and when did it start?     Denies  7. CARDIAC HISTORY: Do you have any history of heart disease? (e.g., heart attack, congestive heart failure)      None reported  8. LUNG HISTORY: Do you have any history of lung disease?  (e.g., pulmonary embolus, asthma, emphysema)     Reactive airway per chart , patient reports related to COVID  10. OTHER SYMPTOMS: Do you have any other symptoms? (e.g., runny nose, wheezing, chest pain)       Wheezing on and off not constant. Head and sinus and chest congestion   patient denies the following  chest pain, difficulty breathing , fever , ear pain, sinus pain  Protocols used: Cough - Acute Productive-A-AH

## 2024-03-28 ENCOUNTER — Ambulatory Visit
Admission: RE | Admit: 2024-03-28 | Discharge: 2024-03-28 | Disposition: A | Discharge status: Home / Self Care | Source: Ambulatory Visit | Attending: Family | Admitting: Family

## 2024-03-28 ENCOUNTER — Ambulatory Visit: Admitting: Family

## 2024-03-28 ENCOUNTER — Encounter: Payer: Self-pay | Admitting: Family

## 2024-03-28 VITALS — BP 110/60 | HR 90 | Temp 98.0°F | Ht 65.0 in | Wt 134.4 lb

## 2024-03-28 DIAGNOSIS — R051 Acute cough: Secondary | ICD-10-CM | POA: Diagnosis not present

## 2024-03-28 DIAGNOSIS — J069 Acute upper respiratory infection, unspecified: Secondary | ICD-10-CM

## 2024-03-28 DIAGNOSIS — J209 Acute bronchitis, unspecified: Secondary | ICD-10-CM

## 2024-03-28 MED ORDER — AMOXICILLIN-POT CLAVULANATE 875-125 MG PO TABS: 1.0000 | ORAL_TABLET | Freq: Two times a day (BID) | ORAL | 0 refills | Status: AC

## 2024-03-28 MED ORDER — BENZONATATE 200 MG PO CAPS: 200.0000 mg | ORAL_CAPSULE | Freq: Two times a day (BID) | ORAL | 0 refills | Status: AC | PRN

## 2024-03-28 NOTE — Progress Notes (Signed)
 "  Acute Office Visit  Subjective:     Patient ID: Gloria Rivera, female    DOB: 16-Mar-1961, 63 y.o.   MRN: [id number]  Chief Complaint  Patient presents with   Cough   Nasal Congestion    HPI Patient is in today with persistent cough, runny nose, congestion.  Cough is productive with brown phlegm.  She was seen at the urgent care on March 16, 2024 and was diagnosed with influenza.  She took Tamiflu  x 2 doses but reports it caused diarrhea so she discontinued the medication.  Since that time, she has continued to struggle with upper respiratory symptoms that have worsened.  Review of Systems  Constitutional:  Negative for chills and fever.  HENT:  Positive for congestion and sore throat.   Respiratory:  Positive for cough, sputum production, shortness of breath and wheezing.   Cardiovascular: Negative.   Gastrointestinal: Negative.   Musculoskeletal: Negative.   Neurological: Negative.   Endo/Heme/Allergies: Negative.   Psychiatric/Behavioral: Negative.     Past Medical History:  Diagnosis Date   Adult acne    Anxiety    Arthritis    both hands   Fracture of right ankle    Seizure (HCC)    Stomach ulcer     Social History   Socioeconomic History   Marital status: Married    Spouse name: Not on file   Number of children: 3   Years of education: Not on file   Highest education level: Master's degree (e.g., MA, MS, MEng, MEd, MSW, MBA)  Occupational History   Occupation: Retired  Tobacco Use   Smoking status: Never    Passive exposure: Never   Smokeless tobacco: Never  Vaping Use   Vaping status: Never Used  Substance and Sexual Activity   Alcohol use: Yes    Alcohol/week: 2.0 standard drinks of alcohol    Types: 2 Glasses of wine per week    Comment: beer/wine/liquor twice a month   Drug use: Never   Sexual activity: Yes    Partners: Male    Birth control/protection: Post-menopausal    Comment: ablation  Other Topics Concern   Not  on file  Social History Narrative   No exercise. 2 sodas of diet pepsi a day    Social Drivers of Health   Tobacco Use: Low Risk (03/28/2024)   Patient History    Smoking Tobacco Use: Never    Smokeless Tobacco Use: Never    Passive Exposure: Never  Financial Resource Strain: Low Risk (10/19/2023)   Overall Financial Resource Strain (CARDIA)    Difficulty of Paying Living Expenses: Not hard at all  Food Insecurity: No Food Insecurity (10/19/2023)   Epic    Worried About Radiation Protection Practitioner of Food in the Last Year: Never true    Ran Out of Food in the Last Year: Never true  Transportation Needs: No Transportation Needs (10/19/2023)   Epic    Lack of Transportation (Medical): No    Lack of Transportation (Non-Medical): No  Physical Activity: Inactive (10/19/2023)   Exercise Vital Sign    Days of Exercise per Week: 0 days    Minutes of Exercise per Session: Not on file  Stress: No Stress Concern Present (10/19/2023)   [employer] of Occupational Health - Occupational Stress Questionnaire    Feeling of Stress: Only a little  Social Connections: Socially Integrated (10/19/2023)   Social Connection and Isolation Panel    Frequency of Communication with Friends and Family: More than  three times a week    Frequency of Social Gatherings with Friends and Family: More than three times a week    Attends Religious Services: More than 4 times per year    Active Member of [employer] or Organizations: Yes    Attends Banker Meetings: More than 4 times per year    Marital Status: Married  Catering Manager Violence: Unknown (07/01/2021)   Received from [facility]   HITS    Physically Hurt: Not on file    Insult or Talk Down To: Not on file    Threaten Physical Harm: Not on file    Scream or Curse: Not on file  Depression (PHQ2-9): Medium Risk (03/28/2024)   Depression (PHQ2-9)    PHQ-2 Score: 5  Alcohol Screen: Low Risk (10/19/2023)   Alcohol Screen    Last  Alcohol Screening Score (AUDIT): 1  Housing: Low Risk (10/19/2023)   Epic    Unable to Pay for Housing in the Last Year: No    Number of Times Moved in the Last Year: 0    Homeless in the Last Year: No  Utilities: Not on file  Health Literacy: Not on file    Past Surgical History:  Procedure Laterality Date   ABLATION  11/2006   ANKLE FRACTURE SURGERY Right 12/2006   with rods in place   BREAST LUMPECTOMY  1997   Benign   CATARACT EXTRACTION Right 10/2020   with lens replacement   JOINT REPLACEMENT     KNEE ARTHROSCOPY Left 1995   Osteoma Removal  12/2012   forehead x 3   TONSILLECTOMY     VITRECTOMY Right 03/12/2021   done at [facility]    Family History  Problem Relation Age of Onset   Obesity Mother        smoker   COPD Father        smoker   Diabetes Sister    Kidney failure Sister    Breast cancer Maternal Grandmother 42   Heart attack Maternal Uncle     Allergies[1]  Medications Ordered Prior to Encounter[2]  BP 110/60 (BP Location: Right Arm, Patient Position: Sitting, Cuff Size: Normal)   Pulse 90   Temp 98 F (36.7 C) (Temporal)   Ht 5' 5 (1.651 m)   Wt 134 lb 6.4 oz (61 kg)   SpO2 96%   BMI 22.37 kg/m chart      Objective:    BP 110/60 (BP Location: Right Arm, Patient Position: Sitting, Cuff Size: Normal)   Pulse 90   Temp 98 F (36.7 C) (Temporal)   Ht 5' 5 (1.651 m)   Wt 134 lb 6.4 oz (61 kg)   SpO2 96%   BMI 22.37 kg/m    Physical Exam Vitals and nursing note reviewed.  Constitutional:      Appearance: Normal appearance. She is normal weight.  HENT:     Right Ear: Tympanic membrane, ear canal and external ear normal.     Left Ear: Tympanic membrane, ear canal and external ear normal.     Nose: Congestion and rhinorrhea present.     Mouth/Throat:     Mouth: Mucous membranes are moist.     Pharynx: Oropharynx is clear.  Cardiovascular:     Rate and Rhythm: Normal rate and regular rhythm.     Pulses: Normal  pulses.     Heart sounds: Normal heart sounds.  Pulmonary:     Effort: Pulmonary effort is normal. No respiratory distress.  Breath sounds: Normal breath sounds. No wheezing.  Musculoskeletal:        General: Normal range of motion.  Skin:    General: Skin is warm and dry.  Neurological:     General: No focal deficit present.     Mental Status: She is alert and oriented to person, place, and time. Mental status is at baseline.  Psychiatric:        Mood and Affect: Mood normal.        Behavior: Behavior normal.        Thought Content: Thought content normal.        Judgment: Judgment normal.    No results found for any visits on 03/28/24.      Assessment & Plan:   Problem List Items Addressed This Visit   None Visit Diagnoses       Upper respiratory tract infection, unspecified type    -  Primary   Relevant Orders   DG Chest 2 View     Acute cough       Relevant Orders   DG Chest 2 View     Acute bronchitis, unspecified organism       Relevant Orders   DG Chest 2 View       Meds ordered this encounter  Medications   amoxicillin -clavulanate (AUGMENTIN ) 875-125 MG tablet    Sig: Take 1 tablet by mouth 2 (two) times daily.    Dispense:  20 tablet    Refill:  0   benzonatate (TESSALON) 200 MG capsule    Sig: Take 1 capsule (200 mg total) by mouth 2 (two) times daily as needed for cough.    Dispense:  20 capsule    Refill:  0   Call the office with any questions or concerns.  Recheck as scheduled and sooner as needed.  Will consider adding a Medrol  Dosepak if necessary pending the results of the chest x-ray. No follow-ups on file.  Aleksa Collinsworth B Prajna Vanderpool, FNP       [1] Allergies Allergen Reactions   Bee Venom   [2] Current Outpatient Medications on File Prior to Visit  Medication Sig Dispense Refill   albuterol  (VENTOLIN  HFA) 108 (90 Base) MCG/ACT inhaler Inhale 2 puffs into the lungs every 6 (six) hours as needed for wheezing or shortness of breath.      Calcium Carbonate (CALCIUM 600 PO) Take 1 tablet by mouth daily.     celecoxib  (CELEBREX ) 200 MG capsule Take 1 capsule (200 mg total) by mouth 2 (two) times daily. 60 capsule 2   EPINEPHrine  0.3 mg/0.3 mL IJ SOAJ injection Inject 0.3 mg into the muscle as needed for anaphylaxis. 1 each 1   Estradiol -Norethindrone  Acet 0.5-0.1 MG tablet 1/2 tab daily 45 tablet 2   fluticasone  (FLOVENT  HFA) 44 MCG/ACT inhaler Inhale 2 puffs into the lungs in the morning and at bedtime. 1 each 12   SUMAtriptan  (IMITREX ) 100 MG tablet TAKE ONE TABLET AT ONSET OF HEADACHE, REPEAT IN TWO HOURS IF NEEDED FOR MAX OF 200 MG DAILY 36 tablet 1   topiramate  (TOPAMAX ) 25 MG tablet TAKE 1 TABLET BY MOUTH TWICE A DAY 180 tablet 1   venlafaxine  XR (EFFEXOR -XR) 150 MG 24 hr capsule Take 1 capsule (150 mg total) by mouth daily with breakfast. 90 capsule 3   VITAMIN D  PO Take 1 capsule by mouth daily.     fluconazole  (DIFLUCAN ) 150 MG tablet Take one po every day for one dose repeat in three days if  still with symptoms (Patient not taking: Reported on 03/28/2024) 2 tablet 0   oseltamivir  (TAMIFLU ) 75 MG capsule Take 1 capsule (75 mg total) by mouth every 12 (twelve) hours. (Patient not taking: Reported on 03/28/2024) 10 capsule 0   No current facility-administered medications on file prior to visit.  "

## 2024-03-30 ENCOUNTER — Other Ambulatory Visit: Payer: Self-pay | Admitting: Medical Genetics

## 2024-03-30 ENCOUNTER — Telehealth: Payer: Self-pay | Admitting: Family

## 2024-03-30 NOTE — Telephone Encounter (Signed)
 Copied from CRM #[id number]. Topic: Clinical - Lab/Test Results >> Mar 30, 2024  3:36 PM Burnard DEL wrote: Reason for CRM: Patient called in to ask about her chest xray results being back. She would like a call  to discuss once results have been reviewed.

## 2024-04-02 ENCOUNTER — Encounter: Admitting: Physician Assistant

## 2024-04-03 ENCOUNTER — Ambulatory Visit: Payer: Self-pay | Admitting: Family

## 2024-04-03 NOTE — Telephone Encounter (Signed)
 Copied from CRM #[id number]. Topic: Clinical - Lab/Test Results >> Mar 30, 2024  3:36 PM Burnard DEL wrote: Reason for CRM: Patient called in to ask about her chest xray results being back. She would like a call  to discuss once results have been reviewed. >> Apr 03, 2024 10:03 AM China J wrote: The patient would like if someone could call her in regards to the DG Chest 2 View she got done on 12/31. She is very unhappy with the delay in results and not having any kind of communication given back to her. I let her know that her results have not been reviewed yet.  Please call the patient at: [phone]

## 2024-04-03 NOTE — Telephone Encounter (Signed)
 Left message with pt to let her know that we are trying to get these results pushed through.

## 2024-04-03 NOTE — Telephone Encounter (Signed)
 Can we expedite her CXR results??

## 2024-04-04 ENCOUNTER — Ambulatory Visit
Admission: RE | Admit: 2024-04-04 | Discharge: 2024-04-04 | Disposition: A | Discharge status: Home / Self Care | Source: Ambulatory Visit | Attending: Obstetrics & Gynecology | Admitting: Obstetrics & Gynecology

## 2024-04-04 DIAGNOSIS — Z1231 Encounter for screening mammogram for malignant neoplasm of breast: Secondary | ICD-10-CM | POA: Insufficient documentation

## 2024-04-05 MED ORDER — FLUCONAZOLE 150 MG PO TABS: ORAL_TABLET | ORAL | 0 refills | Status: AC

## 2024-04-05 NOTE — Addendum Note (Signed)
 Addended by: CORWIN ANTU on: 04/05/2024 05:55 PM   Modules accepted: Orders

## 2024-04-06 ENCOUNTER — Other Ambulatory Visit: Payer: Self-pay | Admitting: Obstetrics & Gynecology

## 2024-04-06 ENCOUNTER — Ambulatory Visit
Admission: RE | Admit: 2024-04-06 | Discharge: 2024-04-06 | Disposition: A | Source: Ambulatory Visit | Attending: Obstetrics & Gynecology | Admitting: Obstetrics & Gynecology

## 2024-04-06 DIAGNOSIS — R928 Other abnormal and inconclusive findings on diagnostic imaging of breast: Secondary | ICD-10-CM

## 2024-04-09 ENCOUNTER — Encounter: Payer: Self-pay | Admitting: *Deleted

## 2024-04-09 ENCOUNTER — Encounter: Payer: Self-pay | Admitting: Physician Assistant

## 2024-04-09 ENCOUNTER — Ambulatory Visit: Admitting: Physician Assistant

## 2024-04-09 ENCOUNTER — Encounter (HOSPITAL_BASED_OUTPATIENT_CLINIC_OR_DEPARTMENT_OTHER): Payer: Self-pay | Admitting: Obstetrics & Gynecology

## 2024-04-09 DIAGNOSIS — M810 Age-related osteoporosis without current pathological fracture: Secondary | ICD-10-CM | POA: Diagnosis not present

## 2024-04-09 MED ORDER — DENOSUMAB 60 MG/ML ~~LOC~~ SOSY: 60.0000 mg | PREFILLED_SYRINGE | Freq: Once | SUBCUTANEOUS | Status: AC

## 2024-04-09 NOTE — Progress Notes (Signed)
 "  Office Visit Note   Patient: Gloria Rivera           Date of Birth: [birth year]           MRN: [id number] Visit Date: 04/09/2024              Requested by: Cleotilde Ronal RAMAN, MD [address] [place],  [place] [address] PCP: Corwin Antu, FNP   Assessment & Plan: Visit Diagnoses:  1. Age-related osteoporosis without current pathological fracture     Plan: Patient is a pleasant 64 year old woman referred by Dr. Cleotilde for evaluation of osteoporosis.  She had a significant decrease in her bone density score especially with regards to her spine which went to -3.8.  She was placed on Actonel  but unfortunately could not tolerate it because of diarrhea.  She has never had a spine fracture.  No history of heart disease or cancers.  She does not have a history of kidney disease no history of ulcers or reflux.  She did have an uterine ablation when she was 46 she was started on hormone replacement which she still takes a small dose of.  She does not smoke she rarely drinks.  She says she does not like to exercise.  And she has had 1 crown but no complications as far as her dental work no family history of osteoporotic fracture.  Given her spine numbers I am significantly worried because she went down significantly from a -2.82 years ago to -3.6.  Ideally I would like to start her on Tymlos however she does not think she could give or get an injection on a daily basis.  We also spoke about Evenity and she was also concerned about that.  She is willing to try Prolia  twice a year she said she may need Valium  and we can preorder for that if she would like we will go forward with authorization of that.  We went through the side effect profile with Prolia  as well as the benefits.  We will place for authorization.  I also asked and encouraged her to get involved in some type of resistive training she does like swimming perhaps she could incorporate a exercise class in the pool.  We also talked about  vitamin D  and calcium intake gave her food list to look at.  Spent 45 minutes reviewing labs we will get a few labs today.  Will place for authorization.  All of her questions were answered  Follow-Up Instructions: No follow-ups on file.   Orders:  No orders of the defined types were placed in this encounter.  No orders of the defined types were placed in this encounter.     Procedures: No procedures performed   Clinical Data: No additional findings.   Subjective: No chief complaint on file.   HPI patient is a pleasant 64 year old woman who comes in today referral from Dr. Cleotilde for evaluation of osteoporosis  Review of Systems  All other systems reviewed and are negative.    Objective: Vital Signs: There were no vitals taken for this visit.  Physical Exam Constitutional:      Appearance: Normal appearance.  Pulmonary:     Effort: Pulmonary effort is normal.  Skin:    General: Skin is warm and dry.  Neurological:     General: No focal deficit present.     Mental Status: She is alert and oriented to person, place, and time.  Psychiatric:  Mood and Affect: Mood normal.       Specialty Comments:  No specialty comments available.  Imaging: No results found.   PMFS History: Patient Active Problem List   Diagnosis Date Noted   Alternating constipation and diarrhea 10/21/2023   Age-related osteoporosis without current pathological fracture 05/19/2023   Prediabetes 05/19/2023   Low serum vitamin B12 05/19/2023   Bursitis of hip 05/19/2023   Elevated platelet count 12/06/2022   Low calcium levels 12/06/2022   Interstitial cystitis 12/06/2022   Allergy to honey bee venom 12/06/2022   Age-related cataract of left eye 12/06/2022   History of second hand smoke exposure 12/06/2022   Neuralgia of lower extremity 12/06/2022   Squamous cell cancer of skin of right cheek 12/06/2022   Reactive airway disease 12/06/2022   Osteopenia of lumbar spine  08/10/2022   Hormone replacement therapy (HRT) 08/10/2022   Migraine 07/19/2022   Anxiety    Primary osteoarthritis of both hands 07/20/2019   Menopausal symptom 09/18/2014   Past Medical History:  Diagnosis Date   Adult acne    Anxiety    Arthritis    both hands   Fracture of right ankle    Seizure (HCC)    Stomach ulcer     Family History  Problem Relation Age of Onset   Obesity Mother        smoker   COPD Father        smoker   Diabetes Sister    Kidney failure Sister    Breast cancer Maternal Grandmother 67   Heart attack Maternal Uncle     Past Surgical History:  Procedure Laterality Date   ABLATION  11/2006   ANKLE FRACTURE SURGERY Right 12/2006   with rods in place   BREAST LUMPECTOMY  1997   Benign   CATARACT EXTRACTION Right 10/2020   with lens replacement   JOINT REPLACEMENT     KNEE ARTHROSCOPY Left 1995   Osteoma Removal  12/2012   forehead x 3   TONSILLECTOMY     VITRECTOMY Right 03/12/2021   done at [facility]   Social History   Occupational History   Occupation: Retired  Tobacco Use   Smoking status: Never    Passive exposure: Never   Smokeless tobacco: Never  Vaping Use   Vaping status: Never Used  Substance and Sexual Activity   Alcohol use: Yes    Alcohol/week: 2.0 standard drinks of alcohol    Types: 2 Glasses of wine per week    Comment: beer/wine/liquor twice a month   Drug use: Never   Sexual activity: Yes    Partners: Male    Birth control/protection: Post-menopausal    Comment: ablation        "

## 2024-04-10 ENCOUNTER — Other Ambulatory Visit (HOSPITAL_BASED_OUTPATIENT_CLINIC_OR_DEPARTMENT_OTHER): Payer: Self-pay | Admitting: Obstetrics & Gynecology

## 2024-04-10 DIAGNOSIS — M810 Age-related osteoporosis without current pathological fracture: Secondary | ICD-10-CM

## 2024-04-10 LAB — CBC
HCT: 37.7 % (ref 35.9–46.0)
Hemoglobin: 12.1 g/dL (ref 11.7–15.5)
MCH: 28.9 pg (ref 27.0–33.0)
MCHC: 32.1 g/dL (ref 31.6–35.4)
MCV: 90.2 fL (ref 81.4–101.7)
MPV: 10.1 fL (ref 7.5–12.5)
Platelets: 452 Thousand/uL — ABNORMAL HIGH (ref 140–400)
RBC: 4.18 Million/uL (ref 3.80–5.10)
RDW: 12.2 % (ref 11.0–15.0)
WBC: 7.6 Thousand/uL (ref 3.8–10.8)

## 2024-04-10 LAB — BASIC METABOLIC PANEL WITH GFR
BUN: 11 mg/dL (ref 7–25)
CO2: 29 mmol/L (ref 20–32)
Calcium: 9 mg/dL (ref 8.6–10.4)
Chloride: 101 mmol/L (ref 98–110)
Creat: 0.58 mg/dL (ref 0.50–1.05)
Glucose, Bld: 86 mg/dL (ref 65–99)
Potassium: 4 mmol/L (ref 3.5–5.3)
Sodium: 136 mmol/L (ref 135–146)
eGFR: 101 mL/min/1.73m2

## 2024-04-10 LAB — VITAMIN D 25 HYDROXY (VIT D DEFICIENCY, FRACTURES): Vit D, 25-Hydroxy: 67 ng/mL (ref 30–100)

## 2024-04-10 MED ORDER — RISEDRONATE SODIUM 150 MG PO TABS: 150.0000 mg | ORAL_TABLET | ORAL | 3 refills | Status: AC

## 2024-04-19 ENCOUNTER — Other Ambulatory Visit (HOSPITAL_BASED_OUTPATIENT_CLINIC_OR_DEPARTMENT_OTHER): Payer: Self-pay | Admitting: Certified Nurse Midwife

## 2024-04-19 DIAGNOSIS — Z9229 Personal history of other drug therapy: Secondary | ICD-10-CM

## 2024-04-19 DIAGNOSIS — Z79890 Hormone replacement therapy (postmenopausal): Secondary | ICD-10-CM

## 2024-04-20 ENCOUNTER — Other Ambulatory Visit (HOSPITAL_BASED_OUTPATIENT_CLINIC_OR_DEPARTMENT_OTHER): Payer: Self-pay

## 2024-04-20 DIAGNOSIS — Z79890 Hormone replacement therapy (postmenopausal): Secondary | ICD-10-CM

## 2024-04-20 DIAGNOSIS — Z9229 Personal history of other drug therapy: Secondary | ICD-10-CM

## 2024-04-20 MED ORDER — ESTRADIOL-NORETHINDRONE ACET 0.5-0.1 MG PO TABS: ORAL_TABLET | ORAL | 0 refills | Status: AC

## 2024-04-27 ENCOUNTER — Other Ambulatory Visit: Payer: Self-pay | Admitting: *Deleted

## 2024-04-27 DIAGNOSIS — G43119 Migraine with aura, intractable, without status migrainosus: Secondary | ICD-10-CM

## 2024-04-27 MED ORDER — TOPIRAMATE 25 MG PO TABS: 25.0000 mg | ORAL_TABLET | Freq: Two times a day (BID) | ORAL | 1 refills | Status: AC

## 2024-08-13 ENCOUNTER — Ambulatory Visit (HOSPITAL_BASED_OUTPATIENT_CLINIC_OR_DEPARTMENT_OTHER): Admitting: Obstetrics & Gynecology
